# Patient Record
Sex: Male | Born: 1965 | Race: Black or African American | Hispanic: No | Marital: Single | Smoking: Current every day smoker
Health system: Southern US, Community
[De-identification: ages and names within clinical notes are randomized; demographics above are authoritative.]

## PROBLEM LIST (undated history)

## (undated) DIAGNOSIS — F32A Depression, unspecified: Secondary | ICD-10-CM

## (undated) DIAGNOSIS — R42 Dizziness and giddiness: Secondary | ICD-10-CM

## (undated) DIAGNOSIS — H269 Unspecified cataract: Secondary | ICD-10-CM

## (undated) DIAGNOSIS — F329 Major depressive disorder, single episode, unspecified: Secondary | ICD-10-CM

## (undated) DIAGNOSIS — M549 Dorsalgia, unspecified: Secondary | ICD-10-CM

## (undated) DIAGNOSIS — H5440 Blindness, one eye, unspecified eye: Secondary | ICD-10-CM

## (undated) HISTORY — DX: Depression, unspecified: F32.A

## (undated) HISTORY — DX: Major depressive disorder, single episode, unspecified: F32.9

## (undated) HISTORY — PX: APPENDECTOMY: SHX54

## (undated) HISTORY — DX: Dorsalgia, unspecified: M54.9

## (undated) HISTORY — DX: Blindness, one eye, unspecified eye: H54.40

---

## 2001-03-22 ENCOUNTER — Emergency Department (HOSPITAL_COMMUNITY): Admission: EM | Admit: 2001-03-22 | Discharge: 2001-03-22 | Payer: Self-pay | Admitting: Internal Medicine

## 2001-06-02 ENCOUNTER — Emergency Department (HOSPITAL_COMMUNITY): Admission: EM | Admit: 2001-06-02 | Discharge: 2001-06-02 | Payer: Self-pay | Admitting: *Deleted

## 2001-06-06 ENCOUNTER — Encounter: Payer: Self-pay | Admitting: Internal Medicine

## 2001-06-06 ENCOUNTER — Ambulatory Visit (HOSPITAL_COMMUNITY): Admission: RE | Admit: 2001-06-06 | Discharge: 2001-06-06 | Payer: Self-pay | Admitting: Internal Medicine

## 2002-10-22 ENCOUNTER — Encounter: Payer: Self-pay | Admitting: Emergency Medicine

## 2002-10-22 ENCOUNTER — Emergency Department (HOSPITAL_COMMUNITY): Admission: EM | Admit: 2002-10-22 | Discharge: 2002-10-22 | Payer: Self-pay | Admitting: Emergency Medicine

## 2003-05-09 ENCOUNTER — Emergency Department (HOSPITAL_COMMUNITY): Admission: EM | Admit: 2003-05-09 | Discharge: 2003-05-09 | Payer: Self-pay | Admitting: Emergency Medicine

## 2003-06-08 ENCOUNTER — Encounter: Payer: Self-pay | Admitting: Emergency Medicine

## 2003-06-08 ENCOUNTER — Emergency Department (HOSPITAL_COMMUNITY): Admission: EM | Admit: 2003-06-08 | Discharge: 2003-06-08 | Payer: Self-pay | Admitting: Emergency Medicine

## 2004-02-17 ENCOUNTER — Emergency Department (HOSPITAL_COMMUNITY): Admission: EM | Admit: 2004-02-17 | Discharge: 2004-02-17 | Payer: Self-pay | Admitting: Emergency Medicine

## 2007-11-10 ENCOUNTER — Emergency Department (HOSPITAL_COMMUNITY): Admission: EM | Admit: 2007-11-10 | Discharge: 2007-11-10 | Payer: Self-pay | Admitting: Emergency Medicine

## 2008-03-04 ENCOUNTER — Emergency Department (HOSPITAL_COMMUNITY): Admission: EM | Admit: 2008-03-04 | Discharge: 2008-03-04 | Payer: Self-pay | Admitting: Emergency Medicine

## 2008-09-26 ENCOUNTER — Emergency Department (HOSPITAL_COMMUNITY): Admission: EM | Admit: 2008-09-26 | Discharge: 2008-09-26 | Payer: Self-pay | Admitting: Emergency Medicine

## 2010-08-13 ENCOUNTER — Emergency Department (HOSPITAL_COMMUNITY): Admission: EM | Admit: 2010-08-13 | Discharge: 2010-08-13 | Payer: Self-pay | Admitting: Emergency Medicine

## 2010-08-14 ENCOUNTER — Emergency Department (HOSPITAL_COMMUNITY): Admission: EM | Admit: 2010-08-14 | Discharge: 2010-08-14 | Payer: Self-pay | Admitting: Emergency Medicine

## 2011-01-20 LAB — CBC
HCT: 44.5 % (ref 39.0–52.0)
Hemoglobin: 14.7 g/dL (ref 13.0–17.0)
MCH: 29.7 pg (ref 26.0–34.0)
MCV: 89.9 fL (ref 78.0–100.0)
RBC: 4.95 MIL/uL (ref 4.22–5.81)
WBC: 6.7 10*3/uL (ref 4.0–10.5)

## 2011-01-20 LAB — BASIC METABOLIC PANEL
CO2: 29 mEq/L (ref 19–32)
Chloride: 104 mEq/L (ref 96–112)
Glucose, Bld: 117 mg/dL — ABNORMAL HIGH (ref 70–99)
Potassium: 4.1 mEq/L (ref 3.5–5.1)
Sodium: 138 mEq/L (ref 135–145)

## 2011-01-20 LAB — DIFFERENTIAL
Eosinophils Absolute: 0 10*3/uL (ref 0.0–0.7)
Eosinophils Relative: 1 % (ref 0–5)
Lymphocytes Relative: 14 % (ref 12–46)
Lymphs Abs: 0.9 10*3/uL (ref 0.7–4.0)
Monocytes Relative: 10 % (ref 3–12)

## 2011-07-12 ENCOUNTER — Encounter: Payer: Self-pay | Admitting: Emergency Medicine

## 2011-07-12 ENCOUNTER — Emergency Department (HOSPITAL_COMMUNITY)
Admission: EM | Admit: 2011-07-12 | Discharge: 2011-07-12 | Disposition: A | Discharge status: Home / Self Care | Payer: Self-pay | Attending: Emergency Medicine | Admitting: Emergency Medicine

## 2011-07-12 ENCOUNTER — Emergency Department (HOSPITAL_COMMUNITY): Payer: Self-pay

## 2011-07-12 DIAGNOSIS — IMO0002 Reserved for concepts with insufficient information to code with codable children: Secondary | ICD-10-CM | POA: Insufficient documentation

## 2011-07-12 DIAGNOSIS — S61209A Unspecified open wound of unspecified finger without damage to nail, initial encounter: Secondary | ICD-10-CM | POA: Insufficient documentation

## 2011-07-12 DIAGNOSIS — S62639A Displaced fracture of distal phalanx of unspecified finger, initial encounter for closed fracture: Secondary | ICD-10-CM | POA: Insufficient documentation

## 2011-07-12 DIAGNOSIS — F17200 Nicotine dependence, unspecified, uncomplicated: Secondary | ICD-10-CM | POA: Insufficient documentation

## 2011-07-12 HISTORY — DX: Dizziness and giddiness: R42

## 2011-07-12 MED ORDER — "THROMBI-PAD 3""X3"" EX PADS"
MEDICATED_PAD | CUTANEOUS | Status: AC
  Filled 2011-07-12: qty 1

## 2011-07-12 MED ORDER — TETANUS-DIPHTHERIA TOXOIDS TD 5-2 LFU IM INJ
0.5000 mL | INJECTION | Freq: Once | INTRAMUSCULAR | Status: AC
  Administered 2011-07-12: 0.5 mL via INTRAMUSCULAR
  Filled 2011-07-12: qty 0.5

## 2011-07-12 MED ORDER — HYDROCODONE-ACETAMINOPHEN 5-325 MG PO TABS: ORAL_TABLET | ORAL | Status: AC

## 2011-07-12 MED ORDER — CEFAZOLIN SODIUM 1 G IJ SOLR
1.0000 g | Freq: Once | INTRAMUSCULAR | Status: AC
  Administered 2011-07-12: 1 g via INTRAMUSCULAR
  Filled 2011-07-12: qty 10

## 2011-07-12 MED ORDER — CEPHALEXIN 500 MG PO CAPS: 500.0000 mg | ORAL_CAPSULE | Freq: Four times a day (QID) | ORAL | Status: AC

## 2011-07-12 NOTE — ED Notes (Signed)
Pt trying to fix wheel on mower, hit blade with his Right hand. Laceration to 2nd digit of R hand.

## 2011-07-12 NOTE — ED Provider Notes (Signed)
History     CSN: [id number] Arrival date & time: 07/12/2011 12:14 PM  Chief Complaint  Patient presents with  . Laceration   HPI Pt was seen at 1345.  Per pt, c/o sudden onset and persistence of constant right middle fingertip wound that began today PTA.  Pt states he needed to fix a wheel on his lawnmower, shut the mower off, but was hit on his right middle fingertip by the blade.  Pt is left handed.  Denies any other injuries.  Denies tingling/numbness in extremity, no focal motor weakness.    Past Medical History  Diagnosis Date  . Dizzy spells     Past Surgical History  Procedure Date  . Appendectomy     Family History  Problem Relation Age of Onset  . Diabetes Mother   . Hypertension Mother   . Cancer Other     History  Substance Use Topics  . Smoking status: Current Everyday Smoker -- 0.5 packs/day    Types: Cigarettes  . Smokeless tobacco: Never Used  . Alcohol Use: No     Review of Systems ROS: Statement: All systems negative except as marked or noted in the HPI; Constitutional: Negative for fever and chills. ; ; Eyes: Negative for eye pain, redness and discharge. ; ; ENMT: Negative for ear pain, hoarseness, nasal congestion, sinus pressure and sore throat. ; ; Cardiovascular: Negative for chest pain, palpitations, diaphoresis, dyspnea and peripheral edema. ; ; Respiratory: Negative for cough, wheezing and stridor. ; ; Gastrointestinal: Negative for nausea, vomiting, diarrhea and abdominal pain, blood in stool, hematemesis, jaundice and rectal bleeding. . ; ; Genitourinary: Negative for dysuria, flank pain and hematuria. ; ; Musculoskeletal: Negative for back pain and neck pain. ; Skin: +finger wound.  Negative for pruritus, rash, abrasions, blisters, bruising and skin lesion.; ; Neuro: Negative for headache, lightheadedness and neck stiffness. Negative for weakness, altered level of consciousness , altered mental status, extremity weakness, paresthesias, involuntary  movement, seizure and syncope.     Physical Exam  BP 120/74  Pulse 70  Temp(Src) 98.4 F (36.9 C) (Oral)  Resp 20  Ht 5\' 7"  (1.702 m)  Wt 170 lb (77.111 kg)  BMI 26.63 kg/m2  SpO2 100%  Physical Exam 1350: Physical examination:  Nursing notes reviewed; Vital signs and O2 SAT reviewed;  Constitutional: Well developed, Well nourished, Well hydrated, In no acute distress; Head:  Normocephalic, atraumatic; Eyes: EOMI, PERRL, No scleral icterus; ENMT: Mouth and pharynx normal, Mucous membranes moist; Neck: Supple, Full range of motion, No lymphadenopathy; Cardiovascular: Regular rate and rhythm, No murmur, rub, or gallop; Respiratory: Breath sounds clear & equal bilaterally, No rales, rhonchi, wheezes, or rub, Normal respiratory effort/excursion; Chest: Nontender, Movement normal;  Extremities: Pulses normal, No tenderness, No edema, No calf edema or asymmetry.; Neuro: AA&Ox3, Major CN grossly intact.  No gross focal motor or sensory deficits in extremities.; Skin: Color normal, Warm, Dry.  +approx 1 cm diameter skin avulsion to R hand middle finger tip, including most distal portion of distal nail.  No subungual hematoma.  Minimal active bleeding.  Wound explored with adequate hemostasis through ROM, as well as in position at the time of injury.  No apparent gross retained foreign body, no visualized involvement of deep structures such as bone/joint/tendon noted.  Baseline strength and sensation to finger with normal light touch, and strong peripheral pulses.  Right hand with motor strength intact through F/E, though pt c/o increasing pain right middle fingertip.  ED Course  Procedures  MDM MDM Reviewed: nursing note and vitals Interpretation: x-ray   Dg Finger Middle Right  07/12/2011  *RADIOLOGY REPORT*  Clinical Data: Laceration and pain.  RIGHT MIDDLE FINGER 2+V  Comparison: None.  Findings: There is a comminuted fracture of the distal phalangeal tuft.  There may be small foci of  radiopaque debris within the overlying injured soft tissues. No joint disruption.  IMPRESSION: A highly comminuted distal phalangeal tuft fracture with probable radiopaque debris in the overlying injured soft tissues.  Original Report Authenticated By: Reyes Ivan, M.D.     1535:  Pt repeating to staff he "doesn't want stitches or shots if I don't have to."  Wound skin appears more avulsed than lacerated and does not appear to have any lacs that can be closed with sutures.  Would cleaned well with NS after xray completed.  No visible debris.  Minimal active bleeding.  IM ancef and Td update given with pt's agreement.  T/C to Ortho Dr. Romeo Apple, case discussed, including:  HPI, pertinent PM/SHx, VS/PE, dx testing, ED course and treatment.  Agreeable to f/u pt in ofc tomorrow and plan to cover with gelfoam, bulky dressing, splint, rx keflex.  Dx testing d/w pt.  Questions answered.  Verb understanding, agreeable to d/c home with outpt f/u tomorrow with Ortho.    Jobina Maita Allison Quarry, DO 07/14/11 1423

## 2011-07-14 ENCOUNTER — Encounter (HOSPITAL_COMMUNITY): Payer: Self-pay | Admitting: *Deleted

## 2011-07-14 ENCOUNTER — Emergency Department (HOSPITAL_COMMUNITY)
Admission: EM | Admit: 2011-07-14 | Discharge: 2011-07-14 | Disposition: A | Discharge status: Home / Self Care | Payer: Self-pay | Attending: Emergency Medicine | Admitting: Emergency Medicine

## 2011-07-14 DIAGNOSIS — Z5189 Encounter for other specified aftercare: Secondary | ICD-10-CM | POA: Insufficient documentation

## 2011-07-14 DIAGNOSIS — S61219A Laceration without foreign body of unspecified finger without damage to nail, initial encounter: Secondary | ICD-10-CM

## 2011-07-14 MED ORDER — CEPHALEXIN 500 MG PO CAPS
500.0000 mg | ORAL_CAPSULE | Freq: Once | ORAL | Status: AC
  Administered 2011-07-14: 500 mg via ORAL
  Filled 2011-07-14: qty 1

## 2011-07-14 MED ORDER — CEPHALEXIN 500 MG PO CAPS: 500.0000 mg | ORAL_CAPSULE | Freq: Four times a day (QID) | ORAL | Status: AC

## 2011-07-14 NOTE — ED Provider Notes (Signed)
History     CSN: [id number] Arrival date & time: 07/14/2011  8:23 AM  Chief Complaint  Patient presents with  . Wound Check   HPI Comments: Portion of R 3rd finger pad avulsed with underlying distal phalynx fracture.  surgicell in place no active bleeding.  No swelling, no streaking and no lymphadenapathy.  Patient is a 45 y.o. male presenting with wound check. The history is provided by the patient. No language interpreter was used.  Wound Check  He was treated in the ED 2 to 3 days ago. Treatments since wound repair include antibiotic ointment use and oral antibiotics. His temperature was unmeasured prior to arrival. There has been no drainage from the wound. There is no redness present. There is no swelling present. The pain has not changed. He has no difficulty moving the affected extremity or digit.    Past Medical History  Diagnosis Date  . Dizzy spells     Past Surgical History  Procedure Date  . Appendectomy     Family History  Problem Relation Age of Onset  . Diabetes Mother   . Hypertension Mother   . Cancer Other     History  Substance Use Topics  . Smoking status: Current Everyday Smoker -- 0.5 packs/day    Types: Cigarettes  . Smokeless tobacco: Never Used  . Alcohol Use: No      Review of Systems  Skin: Positive for wound.  All other systems reviewed and are negative.    Physical Exam  BP 109/68  Pulse 60  Temp(Src) 98.1 F (36.7 C) (Oral)  Resp 16  Ht 5\' 7"  (1.702 m)  Wt 170 lb (77.111 kg)  BMI 26.63 kg/m2  SpO2 99%  Physical Exam  Nursing note and vitals reviewed. Constitutional: He is oriented to person, place, and time. Vital signs are normal. He appears well-developed and well-nourished. No distress.  HENT:  Head: Normocephalic and atraumatic.  Right Ear: External ear normal.  Left Ear: External ear normal.  Nose: Nose normal.  Mouth/Throat: No oropharyngeal exudate.  Eyes: Conjunctivae and EOM are normal. Pupils are equal, round,  and reactive to light. Right eye exhibits no discharge. Left eye exhibits no discharge. No scleral icterus.  Neck: Normal range of motion. Neck supple. No JVD present. No tracheal deviation present. No thyromegaly present.  Cardiovascular: Normal rate, regular rhythm, normal heart sounds, intact distal pulses and normal pulses.  Exam reveals no gallop and no friction rub.   No murmur heard. Pulmonary/Chest: Effort normal and breath sounds normal. No stridor. No respiratory distress. He has no wheezes. He has no rales. He exhibits no tenderness.  Abdominal: Soft. Normal appearance and bowel sounds are normal. He exhibits no distension and no mass. There is no tenderness. There is no rebound and no guarding.  Musculoskeletal: He exhibits no edema and no tenderness.       Right hand: He exhibits decreased range of motion, tenderness, bony tenderness and laceration. He exhibits normal capillary refill, no deformity and no swelling.       Hands: Lymphadenopathy:    He has no cervical adenopathy.  Neurological: He is alert and oriented to person, place, and time. He has normal reflexes. No cranial nerve deficit. Coordination normal. GCS eye subscore is 4. GCS verbal subscore is 5. GCS motor subscore is 6.  Reflex Scores:      Tricep reflexes are 2+ on the right side and 2+ on the left side.      Bicep reflexes are  2+ on the right side and 2+ on the left side.      Brachioradialis reflexes are 2+ on the right side and 2+ on the left side.      Patellar reflexes are 2+ on the right side and 2+ on the left side.      Achilles reflexes are 2+ on the right side and 2+ on the left side. Skin: Skin is warm and dry. No rash noted. He is not diaphoretic.  Psychiatric: He has a normal mood and affect. His speech is normal and behavior is normal. Judgment and thought content normal. Cognition and memory are normal.    ED Course  Procedures  MDM       Worthy Rancher, PA 07/14/11 [id number]

## 2011-07-14 NOTE — ED Notes (Signed)
Waiting for MD---graham crackers and diet coke given

## 2011-07-14 NOTE — ED Notes (Signed)
Finger splint removed from right 3rd digit--dressing irrigated with normal saline to loosen---tip of digit has a mangled appearance--partial missing---dressing with dried blood--no obvious purulent drainage and minimal swelling---voices throbbing pain when not using arm sling--has been unable to afford the antibiotic---advised him he could get it on the $4 program [facility] offers--he said he thought it would be much more expensive than that and will be able to afford $4.

## 2011-07-14 NOTE — ED Notes (Signed)
P.A. In to see pt.  Telfa, Sterile 4 x 4, tube dressing, finger splint applied and secured with ace wrap.  Pt. tolerated well.

## 2011-07-14 NOTE — ED Notes (Signed)
Pt came to ed today to have wound rechecked. Pt cut tip of right middle finger off on Monday of this week.

## 2011-07-15 NOTE — ED Provider Notes (Signed)
Medical screening examination/treatment/procedure(s) were performed by non-physician practitioner and as supervising physician I was immediately available for consultation/collaboration.  Hurman Horn, MD 07/15/11 [phone]

## 2012-06-24 ENCOUNTER — Emergency Department (HOSPITAL_COMMUNITY)
Admission: EM | Admit: 2012-06-24 | Discharge: 2012-06-24 | Disposition: A | Discharge status: Home / Self Care | Payer: Self-pay | Attending: Emergency Medicine | Admitting: Emergency Medicine

## 2012-06-24 ENCOUNTER — Encounter (HOSPITAL_COMMUNITY): Payer: Self-pay | Admitting: *Deleted

## 2012-06-24 DIAGNOSIS — H269 Unspecified cataract: Secondary | ICD-10-CM

## 2012-06-24 DIAGNOSIS — H5710 Ocular pain, unspecified eye: Secondary | ICD-10-CM | POA: Insufficient documentation

## 2012-06-24 DIAGNOSIS — F17200 Nicotine dependence, unspecified, uncomplicated: Secondary | ICD-10-CM | POA: Insufficient documentation

## 2012-06-24 HISTORY — DX: Unspecified cataract: H26.9

## 2012-06-24 MED ORDER — HYDROCODONE-ACETAMINOPHEN 5-325 MG PO TABS
1.0000 | ORAL_TABLET | Freq: Once | ORAL | Status: AC
  Administered 2012-06-24: 1 via ORAL
  Filled 2012-06-24: qty 1

## 2012-06-24 MED ORDER — HYDROCODONE-ACETAMINOPHEN 5-325 MG PO TABS: 1.0000 | ORAL_TABLET | Freq: Four times a day (QID) | ORAL | Status: AC | PRN

## 2012-06-24 NOTE — ED Provider Notes (Cosign Needed)
History  This chart was scribed for Kevin Lennert, MD by Kevin Moreno. This patient was seen in room TR02C/TR02C and the patient's care was started at 1529.   CSN: [id number]  Arrival date & time 06/24/12  1529   None     Chief Complaint  Patient presents with  . Eye Pain   Patient is a 46 y.o. male presenting with eye pain. The history is provided by the patient. No language interpreter was used.  Eye Pain This is a chronic problem. The current episode started more than 1 week ago. The problem occurs constantly. The problem has been gradually worsening. Pertinent negatives include no chest pain, no abdominal pain and no headaches. Nothing aggravates the symptoms. Nothing relieves the symptoms. He has tried nothing for the symptoms.   Kevin Moreno is a 46 y.o. male who presents to the Emergency Department complaining of constant gradually worsening left eye pain and and vision complaints. He states for the past five days he has had worsening vision of his left eye which is usually blurry from cataracts but is now dark and he sees almost nothing from his left eye. He also states left upper eye pain which he was told is from a torn ligament in his eye. He states that he recently saw Dr. Marlyne Beards for this problem and is scheduled to see eye DM specialist Dr. Ashley Royalty in two days for this problem. He denies any other injuries/illnesses at this time.   Past Medical History  Diagnosis Date  . Dizzy spells   . Cataract     Past Surgical History  Procedure Date  . Appendectomy     Family History  Problem Relation Age of Onset  . Diabetes Mother   . Hypertension Mother   . Cancer Other     History  Substance Use Topics  . Smoking status: Current Everyday Smoker -- 0.5 packs/Moreno    Types: Cigarettes  . Smokeless tobacco: Never Used  . Alcohol Use: No      Review of Systems  Constitutional: Negative for fatigue.  HENT: Negative for congestion, sinus pressure and ear  discharge.   Eyes: Positive for pain. Negative for discharge.       Left upper eye pain and worsening vision.   Respiratory: Negative for cough.   Cardiovascular: Negative for chest pain.  Gastrointestinal: Negative for abdominal pain and diarrhea.  Genitourinary: Negative for frequency and hematuria.  Musculoskeletal: Negative for back pain.  Skin: Negative for rash.  Neurological: Negative for seizures and headaches.  Hematological: Negative.   Psychiatric/Behavioral: Negative for hallucinations.  All other systems reviewed and are negative.    Allergies  Review of patient's allergies indicates no known allergies.  Home Medications   Current Outpatient Rx  Name Route Sig Dispense Refill  . ACETAMINOPHEN 500 MG PO TABS Oral Take 500 mg by mouth every 6 (six) hours as needed. For pain    . CLEAR EYES MAX REDNESS RELIEF OP Left Eye Place 1 drop into the left eye daily as needed. For dry/red eyes      Triage Vitals: BP 123/76  Pulse 77  Temp 98.2 F (36.8 C) (Oral)  Resp 20  SpO2 100%  Physical Exam  Nursing note and vitals reviewed. Constitutional: He is oriented to person, place, and time. He appears well-developed.  HENT:  Head: Normocephalic.  Eyes: Conjunctivae are normal.       Left irregular pupil 4 cm dilated and non response to light. His left  upper eyelid is tender.   Neck: No tracheal deviation present.  Cardiovascular:  No murmur heard. Musculoskeletal: Normal range of motion.  Neurological: He is oriented to person, place, and time.  Skin: Skin is warm.  Psychiatric: He has a normal mood and affect.   Pressure checked with tonopen.  Pressure right eye 20 ED Course  Procedures (including critical care time) DIAGNOSTIC STUDIES: Oxygen Saturation is 100% on room air, normal by my interpretation.    COORDINATION OF CARE: At 400 PM Discussed treatment plan with patient which includes consult to opthamology. Patient agrees.   Labs Reviewed - No data to  display No results found.   No diagnosis found.  I spoke with dr. Luciana Axe and he will see the pt Monday if dr. Ashley Royalty does not see the pt  MDM  The chart was scribed for me under my direct supervision.  I personally performed the history, physical, and medical decision making and all procedures in the evaluation of this patient.Kevin Lennert, MD 06/24/12 1700

## 2012-06-24 NOTE — ED Notes (Signed)
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 Was told to come here if he had any pain.

## 2012-06-26 ENCOUNTER — Encounter (INDEPENDENT_AMBULATORY_CARE_PROVIDER_SITE_OTHER): Payer: Self-pay | Admitting: Ophthalmology

## 2012-11-28 ENCOUNTER — Encounter (HOSPITAL_COMMUNITY): Payer: Self-pay | Admitting: Emergency Medicine

## 2012-11-28 ENCOUNTER — Emergency Department (HOSPITAL_COMMUNITY)
Admission: EM | Admit: 2012-11-28 | Discharge: 2012-11-28 | Disposition: A | Discharge status: Home / Self Care | Payer: Self-pay | Attending: Emergency Medicine | Admitting: Emergency Medicine

## 2012-11-28 DIAGNOSIS — R21 Rash and other nonspecific skin eruption: Secondary | ICD-10-CM | POA: Insufficient documentation

## 2012-11-28 DIAGNOSIS — F17200 Nicotine dependence, unspecified, uncomplicated: Secondary | ICD-10-CM | POA: Insufficient documentation

## 2012-11-28 DIAGNOSIS — L02419 Cutaneous abscess of limb, unspecified: Secondary | ICD-10-CM | POA: Insufficient documentation

## 2012-11-28 DIAGNOSIS — Z8669 Personal history of other diseases of the nervous system and sense organs: Secondary | ICD-10-CM | POA: Insufficient documentation

## 2012-11-28 MED ORDER — BACITRACIN ZINC 500 UNIT/GM EX OINT
TOPICAL_OINTMENT | CUTANEOUS | Status: AC
  Administered 2012-11-28: 1
  Filled 2012-11-28: qty 0.9

## 2012-11-28 MED ORDER — SULFAMETHOXAZOLE-TMP DS 800-160 MG PO TABS
1.0000 | ORAL_TABLET | Freq: Once | ORAL | Status: AC
  Administered 2012-11-28: 1 via ORAL
  Filled 2012-11-28: qty 1

## 2012-11-28 MED ORDER — HYDROCODONE-ACETAMINOPHEN 5-325 MG PO TABS: ORAL_TABLET | ORAL | Status: DC

## 2012-11-28 MED ORDER — SULFAMETHOXAZOLE-TRIMETHOPRIM 800-160 MG PO TABS: 1.0000 | ORAL_TABLET | Freq: Two times a day (BID) | ORAL | Status: DC

## 2012-11-28 NOTE — ED Notes (Signed)
Pt presents with abscess to back of right lower leg.

## 2012-11-28 NOTE — ED Provider Notes (Signed)
History     CSN: [id number]  Arrival date & time 11/28/12  1146   First MD Initiated Contact with Patient 11/28/12 1230      Chief Complaint  Patient presents with  . Abscess    (Consider location/radiation/quality/duration/timing/severity/associated sxs/prior treatment) HPI Comments: Patient c/o redness, painful and slightly itchy lesion to the right lower leg.  States she is unsure if he may have been bitten by a spider or insect.  Denies hx of previous boils or MRSA.  Pain is worse with weight bearing.  He denies fever, chills, swelling or weakness of the affected extremity  Patient is a 47 y.o. male presenting with rash. The history is provided by the patient.  Rash  This is a new problem. The current episode started more than 2 days ago. The problem has not changed since onset.The problem is associated with an unknown factor. There has been no fever. The rash is present on the right lower leg. The pain is mild. The pain has been constant since onset. Associated symptoms include itching and pain. He has tried nothing for the symptoms. The treatment provided no relief.    Past Medical History  Diagnosis Date  . Dizzy spells   . Cataract     Past Surgical History  Procedure Date  . Appendectomy     Family History  Problem Relation Age of Onset  . Diabetes Mother   . Hypertension Mother   . Cancer Other     History  Substance Use Topics  . Smoking status: Current Every Day Smoker -- 0.5 packs/day    Types: Cigarettes  . Smokeless tobacco: Never Used  . Alcohol Use: No      Review of Systems  Constitutional: Negative for fever and chills.  Gastrointestinal: Negative for nausea and vomiting.  Musculoskeletal: Negative for joint swelling and arthralgias.  Skin: Positive for color change and itching. Negative for rash.       Red lesion to right lower leg  Hematological: Negative for adenopathy.  All other systems reviewed and are negative.    Allergies    Review of patient's allergies indicates no known allergies.  Home Medications   Current Outpatient Rx  Name  Route  Sig  Dispense  Refill  . ACETAMINOPHEN 500 MG PO TABS   Oral   Take 500 mg by mouth every 6 (six) hours as needed. For pain         . CLEAR EYES MAX REDNESS RELIEF OP   Left Eye   Place 1 drop into the left eye daily as needed. For dry/red eyes           BP 121/86  Pulse 82  Temp 98.3 F (36.8 C) (Oral)  Resp 18  SpO2 100%  Physical Exam  Nursing note and vitals reviewed. Constitutional: He is oriented to person, place, and time. He appears well-developed and well-nourished. No distress.  HENT:  Head: Normocephalic and atraumatic.  Cardiovascular: Normal rate, regular rhythm and normal heart sounds.   Pulmonary/Chest: Effort normal and breath sounds normal.  Musculoskeletal: He exhibits tenderness. He exhibits no edema.       Right lower leg: He exhibits no bony tenderness, no swelling, no deformity and no laceration.       Legs: Neurological: He is alert and oriented to person, place, and time. He exhibits normal muscle tone. Coordination normal.  Skin: Skin is warm. There is erythema.       Localized area of erythema with mild induration  to the right lower leg.  No fluctuance, no surrounding erythema/      ED Course  Procedures (including critical care time)  Labs Reviewed - No data to display No results found.      MDM    Localized area of erythema and induration to the right lower leg.  No fluctuance or drainage.  Probable early abscess.  NV intact.  Pt agrees to elevate , warm water soaks and abx.  Advised to return here if the sx's worsen  Prescribed: Bactrim DS norco #20      Shadiyah Wernli L. Batool Majid, [place] 11/30/12 1603

## 2012-11-30 NOTE — ED Provider Notes (Signed)
Medical screening examination/treatment/procedure(s) were performed by non-physician practitioner and as supervising physician I was immediately available for consultation/collaboration.   Cayson Kalb L Cleo Villamizar, MD 11/30/12 1630 

## 2012-12-01 ENCOUNTER — Emergency Department (HOSPITAL_COMMUNITY)
Admission: EM | Admit: 2012-12-01 | Discharge: 2012-12-01 | Disposition: A | Discharge status: Home / Self Care | Payer: Self-pay | Attending: Emergency Medicine | Admitting: Emergency Medicine

## 2012-12-01 ENCOUNTER — Encounter (HOSPITAL_COMMUNITY): Payer: Self-pay | Admitting: *Deleted

## 2012-12-01 DIAGNOSIS — L03119 Cellulitis of unspecified part of limb: Secondary | ICD-10-CM | POA: Insufficient documentation

## 2012-12-01 DIAGNOSIS — L02419 Cutaneous abscess of limb, unspecified: Secondary | ICD-10-CM | POA: Insufficient documentation

## 2012-12-01 DIAGNOSIS — L0291 Cutaneous abscess, unspecified: Secondary | ICD-10-CM

## 2012-12-01 DIAGNOSIS — F17200 Nicotine dependence, unspecified, uncomplicated: Secondary | ICD-10-CM | POA: Insufficient documentation

## 2012-12-01 DIAGNOSIS — H269 Unspecified cataract: Secondary | ICD-10-CM | POA: Insufficient documentation

## 2012-12-01 DIAGNOSIS — Z8669 Personal history of other diseases of the nervous system and sense organs: Secondary | ICD-10-CM | POA: Insufficient documentation

## 2012-12-01 DIAGNOSIS — A4901 Methicillin susceptible Staphylococcus aureus infection, unspecified site: Secondary | ICD-10-CM | POA: Insufficient documentation

## 2012-12-01 MED ORDER — LIDOCAINE-EPINEPHRINE (PF) 1 %-1:200000 IJ SOLN
10.0000 mL | Freq: Once | INTRAMUSCULAR | Status: DC
  Filled 2012-12-01: qty 10

## 2012-12-01 MED ORDER — OXYCODONE-ACETAMINOPHEN 5-325 MG PO TABS: 1.0000 | ORAL_TABLET | Freq: Four times a day (QID) | ORAL | Status: AC | PRN

## 2012-12-01 MED ORDER — SULFAMETHOXAZOLE-TMP DS 800-160 MG PO TABS
1.0000 | ORAL_TABLET | Freq: Once | ORAL | Status: AC
  Administered 2012-12-01: 1 via ORAL
  Filled 2012-12-01: qty 1

## 2012-12-01 MED ORDER — AMOXICILLIN 500 MG PO CAPS: 500.0000 mg | ORAL_CAPSULE | Freq: Three times a day (TID) | ORAL | Status: DC

## 2012-12-01 MED ORDER — AMOXICILLIN-POT CLAVULANATE 875-125 MG PO TABS
1.0000 | ORAL_TABLET | Freq: Once | ORAL | Status: AC
  Administered 2012-12-01: 1 via ORAL
  Filled 2012-12-01: qty 1

## 2012-12-01 MED ORDER — ONDANSETRON HCL 4 MG PO TABS
4.0000 mg | ORAL_TABLET | Freq: Once | ORAL | Status: AC
  Administered 2012-12-01: 4 mg via ORAL
  Filled 2012-12-01: qty 1

## 2012-12-01 NOTE — ED Provider Notes (Signed)
History     CSN: [id number]  Arrival date & time 12/01/12  1344   First MD Initiated Contact with Patient 12/01/12 1401      Chief Complaint  Patient presents with  . Wound Check    (Consider location/radiation/quality/duration/timing/severity/associated sxs/prior treatment) Patient is a 47 y.o. male presenting with wound check. The history is provided by the patient.  Wound Check  He was treated in the ED 5 to 10 days ago. Prior ED Treatment: treatment of abscess of the right calf. Treatments since wound repair include oral antibiotics. Fever duration: none. There has been no drainage from the wound. The redness has not changed. The swelling has not changed. The pain has worsened.    Past Medical History  Diagnosis Date  . Dizzy spells   . Cataract     Past Surgical History  Procedure Date  . Appendectomy     Family History  Problem Relation Age of Onset  . Diabetes Mother   . Hypertension Mother   . Cancer Other     History  Substance Use Topics  . Smoking status: Current Every Day Smoker -- 0.5 packs/day    Types: Cigarettes  . Smokeless tobacco: Never Used  . Alcohol Use: No      Review of Systems  Constitutional: Negative for activity change.       All ROS Neg except as noted in HPI  HENT: Negative for nosebleeds and neck pain.   Eyes: Negative for photophobia and discharge.  Respiratory: Negative for cough, shortness of breath and wheezing.   Cardiovascular: Negative for chest pain and palpitations.  Gastrointestinal: Negative for abdominal pain and blood in stool.  Genitourinary: Negative for dysuria, frequency and hematuria.  Musculoskeletal: Negative for back pain and arthralgias.  Skin: Positive for wound.  Neurological: Negative for dizziness, seizures and speech difficulty.  Psychiatric/Behavioral: Negative for hallucinations and confusion.    Allergies  Review of patient's allergies indicates no known allergies.  Home Medications    Current Outpatient Rx  Name  Route  Sig  Dispense  Refill  . ACETAMINOPHEN 500 MG PO TABS   Oral   Take 500 mg by mouth every 6 (six) hours as needed. For pain         . IBUPROFEN 200 MG PO TABS   Oral   Take 600 mg by mouth every 6 (six) hours as needed. For pain         . CLEAR EYES MAX REDNESS RELIEF OP   Left Eye   Place 1 drop into the left eye daily as needed. For dry/red eyes         . SULFAMETHOXAZOLE-TRIMETHOPRIM 800-160 MG PO TABS   Oral   Take 1 tablet by mouth 2 (two) times daily.   28 tablet   0     BP 130/76  Pulse 89  Temp 97.7 F (36.5 C) (Oral)  Resp 18  Ht 5\' 7"  (1.702 m)  Wt 160 lb (72.576 kg)  BMI 25.06 kg/m2  SpO2 100%  Physical Exam  Nursing note and vitals reviewed. Constitutional: He is oriented to person, place, and time. He appears well-developed and well-nourished.  Non-toxic appearance.  HENT:  Head: Normocephalic.  Right Ear: Tympanic membrane and external ear normal.  Left Ear: Tympanic membrane and external ear normal.  Eyes: EOM and lids are normal. Pupils are equal, round, and reactive to light.  Neck: Normal range of motion. Neck supple. Carotid bruit is not present.  Cardiovascular: Normal rate,  regular rhythm, normal heart sounds, intact distal pulses and normal pulses.   Pulmonary/Chest: Breath sounds normal. No respiratory distress.  Abdominal: Soft. Bowel sounds are normal. There is no tenderness. There is no guarding.  Musculoskeletal: Normal range of motion.       Abscess of the posterior calf on the right with increasing cellulitis margins. Tender to palpation.  Lymphadenopathy:       Head (right side): No submandibular adenopathy present.       Head (left side): No submandibular adenopathy present.    He has no cervical adenopathy.  Neurological: He is alert and oriented to person, place, and time. He has normal strength. No cranial nerve deficit or sensory deficit.  Skin: Skin is warm and dry.  Psychiatric: He  has a normal mood and affect. His speech is normal.    ED Course  INCISION AND DRAINAGE Date/Time: 12/01/2012 3:45 PM Performed by: Kathie Dike Authorized by: Kathie Dike Consent: Verbal consent obtained. Risks and benefits: risks, benefits and alternatives were discussed Consent given by: patient Patient understanding: patient states understanding of the procedure being performed Patient identity confirmed: arm band Time out: Immediately prior to procedure a "time out" was called to verify the correct patient, procedure, equipment, support staff and site/side marked as required. Type: abscess Body area: lower extremity Location details: right leg Anesthesia: local infiltration Local anesthetic: lidocaine 1% with epinephrine Patient sedated: no Scalpel size: 15 Incision type: single straight Complexity: simple Drainage: purulent Drainage amount: moderate Wound treatment: wound left open Patient tolerance: Patient tolerated the procedure well with no immediate complications.   (including critical care time)  Labs Reviewed - No data to display No results found.   No diagnosis found.    MDM  **I have reviewed nursing notes, vital signs, and all appropriate lab and imaging results for this patient.* Patient presented to the emergency department with an abscess of the right calf. Incision and drainage was carried out. Pt to continue septra. Amoxil added. Rx for percocet given. Pt to return if  Any change or problem.   Kathie Dike, [place] 12/05/12 1540

## 2012-12-01 NOTE — ED Notes (Signed)
Here for recheck of abscess of rt lower leg.

## 2012-12-04 LAB — CULTURE, ROUTINE-ABSCESS: Special Requests: NORMAL

## 2012-12-05 NOTE — ED Notes (Signed)
+  urine Patient treated with Septra-sensitive to same-chart appended per protocol MD. 

## 2012-12-06 NOTE — ED Provider Notes (Signed)
Medical screening examination/treatment/procedure(s) were performed by non-physician practitioner and as supervising physician I was immediately available for consultation/collaboration.  Mabell Esguerra, MD 12/06/12 2346 

## 2013-04-05 ENCOUNTER — Emergency Department (HOSPITAL_COMMUNITY)
Admission: EM | Admit: 2013-04-05 | Discharge: 2013-04-05 | Disposition: A | Discharge status: Home / Self Care | Payer: Self-pay | Attending: Emergency Medicine | Admitting: Emergency Medicine

## 2013-04-05 ENCOUNTER — Encounter (HOSPITAL_COMMUNITY): Payer: Self-pay | Admitting: Emergency Medicine

## 2013-04-05 DIAGNOSIS — F17200 Nicotine dependence, unspecified, uncomplicated: Secondary | ICD-10-CM | POA: Insufficient documentation

## 2013-04-05 DIAGNOSIS — N342 Other urethritis: Secondary | ICD-10-CM | POA: Insufficient documentation

## 2013-04-05 DIAGNOSIS — R369 Urethral discharge, unspecified: Secondary | ICD-10-CM | POA: Insufficient documentation

## 2013-04-05 DIAGNOSIS — Z8669 Personal history of other diseases of the nervous system and sense organs: Secondary | ICD-10-CM | POA: Insufficient documentation

## 2013-04-05 LAB — URINALYSIS, ROUTINE W REFLEX MICROSCOPIC
Bilirubin Urine: NEGATIVE
Ketones, ur: NEGATIVE mg/dL
Nitrite: NEGATIVE
Urobilinogen, UA: 1 mg/dL (ref 0.0–1.0)
pH: 6.5 (ref 5.0–8.0)

## 2013-04-05 LAB — URINE MICROSCOPIC-ADD ON

## 2013-04-05 MED ORDER — CEFIXIME 400 MG PO TABS
400.0000 mg | ORAL_TABLET | Freq: Every day | ORAL | Status: DC
  Administered 2013-04-05: 400 mg via ORAL
  Filled 2013-04-05: qty 1

## 2013-04-05 MED ORDER — AZITHROMYCIN 250 MG PO TABS
1000.0000 mg | ORAL_TABLET | Freq: Every day | ORAL | Status: DC
  Administered 2013-04-05: 1000 mg via ORAL
  Filled 2013-04-05: qty 4

## 2013-04-05 NOTE — ED Notes (Signed)
Pt c/o dysuria and white "pus" d/c from penis. X 4 days. Nad.

## 2013-04-05 NOTE — ED Provider Notes (Signed)
History     CSN: [id number]  Arrival date & time 04/05/13  1542   First MD Initiated Contact with Patient 04/05/13 1610      Chief Complaint  Patient presents with  . Dysuria    (Consider location/radiation/quality/duration/timing/severity/associated sxs/prior treatment) HPI Comments: Kevin Moreno is a 47 y.o. Male presenting with dysuria and white pus draining from his penis for the past 4 days.  He denies fevers, chills, nausea or vomiting.  He denies being sexually active for the past 4 months and uses condoms when he does have sex.  He has found no alleviators.  He denies rash.     The history is provided by the patient.    Past Medical History  Diagnosis Date  . Dizzy spells   . Cataract     Past Surgical History  Procedure Laterality Date  . Appendectomy      Family History  Problem Relation Age of Onset  . Diabetes Mother   . Hypertension Mother   . Cancer Other     History  Substance Use Topics  . Smoking status: Current Every Day Smoker -- 0.50 packs/day    Types: Cigarettes  . Smokeless tobacco: Never Used  . Alcohol Use: No      Review of Systems  Constitutional: Negative for fever.  HENT: Negative for congestion, sore throat and neck pain.   Eyes: Negative.   Respiratory: Negative for chest tightness and shortness of breath.   Cardiovascular: Negative for chest pain.  Gastrointestinal: Negative for nausea and abdominal pain.  Genitourinary: Positive for dysuria and discharge. Negative for urgency, frequency and flank pain.  Musculoskeletal: Negative for joint swelling and arthralgias.  Skin: Negative.  Negative for rash and wound.  Neurological: Negative for dizziness, weakness, light-headedness, numbness and headaches.  Psychiatric/Behavioral: Negative.     Allergies  Review of patient's allergies indicates no known allergies.  Home Medications   Current Outpatient Rx  Name  Route  Sig  Dispense  Refill  . acetaminophen  (TYLENOL) 500 MG tablet   Oral   Take 500 mg by mouth every 6 (six) hours as needed. For pain         . amoxicillin (AMOXIL) 500 MG capsule   Oral   Take 1 capsule (500 mg total) by mouth 3 (three) times daily.   21 capsule   0   . ibuprofen (ADVIL,MOTRIN) 200 MG tablet   Oral   Take 600 mg by mouth every 6 (six) hours as needed. For pain         . Naphazoline-Glycerin (CLEAR EYES MAX REDNESS RELIEF OP)   Left Eye   Place 1 drop into the left eye daily as needed. For dry/red eyes         . sulfamethoxazole-trimethoprim (SEPTRA DS) 800-160 MG per tablet   Oral   Take 1 tablet by mouth 2 (two) times daily.   28 tablet   0     BP 111/75  Pulse 94  Temp(Src) 97.6 F (36.4 C) (Oral)  Resp 17  SpO2 99%  Physical Exam  Nursing note and vitals reviewed. Constitutional: He appears well-developed and well-nourished.  HENT:  Head: Normocephalic and atraumatic.  Eyes: Conjunctivae are normal.  Neck: Normal range of motion.  Cardiovascular: Normal rate, regular rhythm, normal heart sounds and intact distal pulses.   Pulmonary/Chest: Effort normal and breath sounds normal. He has no wheezes.  Abdominal: Soft. Bowel sounds are normal. There is no tenderness.  Genitourinary: Testes normal.  Circumcised. Discharge found.  Musculoskeletal: Normal range of motion.  Neurological: He is alert.  Skin: Skin is warm and dry.  Psychiatric: He has a normal mood and affect.    ED Course  Procedures (including critical care time)  Labs Reviewed  URINALYSIS, ROUTINE W REFLEX MICROSCOPIC - Abnormal; Notable for the following:    APPearance CLOUDY (*)    Specific Gravity, Urine >1.030 (*)    Hgb urine dipstick TRACE (*)    Protein, ur TRACE (*)    Leukocytes, UA TRACE (*)    All other components within normal limits  URINE MICROSCOPIC-ADD ON - Abnormal; Notable for the following:    Bacteria, UA FEW (*)    All other components within normal limits  URINE CULTURE   No results  found.   No diagnosis found.    MDM  Patients labs and/or radiological studies were viewed and considered during the medical decision making and disposition process.   Gc/chlamydia cx obtained,  ua culture pending. Pt was given zithromax 1 gram,  Cefixime 400 mg po x 1.  Prn f/u.  Pt advised of pending cultures.  Also referred to health dept for f/u care.  Advised safe sex, abstinence for the next 7 days or until sx resolve.        Burgess Amor, PA-C 04/05/13 1653

## 2013-04-05 NOTE — ED Provider Notes (Signed)
Medical screening examination/treatment/procedure(s) were performed by non-physician practitioner and as supervising physician I was immediately available for consultation/collaboration.   Haneefah Venturini L Gurpreet Mikhail, MD 04/05/13 2352 

## 2013-04-05 NOTE — ED Notes (Signed)
Pt started having sl dysuria yesterday with d/c, alert, NAD

## 2013-04-06 LAB — URINE CULTURE
Colony Count: NO GROWTH
Culture: NO GROWTH

## 2013-04-10 LAB — GC/CHLAMYDIA PROBE AMP: GC Probe RNA: POSITIVE — AB

## 2013-04-11 ENCOUNTER — Telehealth (HOSPITAL_COMMUNITY): Payer: Self-pay | Admitting: Emergency Medicine

## 2013-04-11 NOTE — ED Notes (Signed)
+   Gonorrhea  Patient treated with Rocephin And Zithromax-[facility] faxed  

## 2013-04-12 ENCOUNTER — Telehealth (HOSPITAL_COMMUNITY): Payer: Self-pay | Admitting: Emergency Medicine

## 2013-04-13 ENCOUNTER — Telehealth (HOSPITAL_COMMUNITY): Payer: Self-pay | Admitting: Emergency Medicine

## 2013-04-14 ENCOUNTER — Telehealth (HOSPITAL_COMMUNITY): Payer: Self-pay | Admitting: Emergency Medicine

## 2013-04-14 NOTE — ED Notes (Signed)
Unable to contact patient via phone. Sent letter. °

## 2013-05-19 ENCOUNTER — Telehealth (HOSPITAL_COMMUNITY): Payer: Self-pay | Admitting: Emergency Medicine

## 2013-05-19 NOTE — ED Notes (Signed)
No response to letter sent after 30 days. Chart sent to Medical Records. °

## 2013-12-29 ENCOUNTER — Emergency Department (HOSPITAL_COMMUNITY)
Admission: EM | Admit: 2013-12-29 | Discharge: 2013-12-29 | Disposition: A | Discharge status: Home / Self Care | Payer: BC Managed Care – PPO | Attending: Emergency Medicine | Admitting: Emergency Medicine

## 2013-12-29 ENCOUNTER — Encounter (HOSPITAL_COMMUNITY): Payer: Self-pay | Admitting: Emergency Medicine

## 2013-12-29 ENCOUNTER — Emergency Department (HOSPITAL_COMMUNITY): Payer: BC Managed Care – PPO

## 2013-12-29 DIAGNOSIS — S93409A Sprain of unspecified ligament of unspecified ankle, initial encounter: Secondary | ICD-10-CM | POA: Insufficient documentation

## 2013-12-29 DIAGNOSIS — Y9389 Activity, other specified: Secondary | ICD-10-CM | POA: Insufficient documentation

## 2013-12-29 DIAGNOSIS — F17200 Nicotine dependence, unspecified, uncomplicated: Secondary | ICD-10-CM | POA: Insufficient documentation

## 2013-12-29 DIAGNOSIS — X500XXA Overexertion from strenuous movement or load, initial encounter: Secondary | ICD-10-CM | POA: Insufficient documentation

## 2013-12-29 DIAGNOSIS — Y9289 Other specified places as the place of occurrence of the external cause: Secondary | ICD-10-CM | POA: Insufficient documentation

## 2013-12-29 DIAGNOSIS — S93401A Sprain of unspecified ligament of right ankle, initial encounter: Secondary | ICD-10-CM

## 2013-12-29 DIAGNOSIS — S0990XA Unspecified injury of head, initial encounter: Secondary | ICD-10-CM | POA: Insufficient documentation

## 2013-12-29 DIAGNOSIS — Z8669 Personal history of other diseases of the nervous system and sense organs: Secondary | ICD-10-CM | POA: Insufficient documentation

## 2013-12-29 MED ORDER — OXYCODONE-ACETAMINOPHEN 5-325 MG PO TABS
2.0000 | ORAL_TABLET | Freq: Once | ORAL | Status: AC
  Administered 2013-12-29: 2 via ORAL
  Filled 2013-12-29: qty 2

## 2013-12-29 MED ORDER — ONDANSETRON 8 MG PO TBDP
8.0000 mg | ORAL_TABLET | Freq: Once | ORAL | Status: AC
  Administered 2013-12-29: 8 mg via ORAL

## 2013-12-29 MED ORDER — HYDROCODONE-ACETAMINOPHEN 5-325 MG PO TABS: 2.0000 | ORAL_TABLET | ORAL | Status: DC | PRN

## 2013-12-29 MED ORDER — IBUPROFEN 800 MG PO TABS: 800.0000 mg | ORAL_TABLET | Freq: Three times a day (TID) | ORAL | Status: DC

## 2013-12-29 MED ORDER — ONDANSETRON 8 MG PO TBDP
ORAL_TABLET | ORAL | Status: DC
Start: 2013-12-29 — End: 2013-12-30
  Filled 2013-12-29: qty 1

## 2013-12-29 NOTE — ED Notes (Signed)
C/O nausea.

## 2013-12-29 NOTE — ED Provider Notes (Signed)
CSN: [id number]     Arrival date & time 12/29/13  2032 History  This chart was scribed for Glynn Octave, MD by Bennett Scrape, ED Scribe. This patient was seen in room [facility]14[facility]14 and the patient's care was started at 9:02 PM.   Chief Complaint  Patient presents with  . Ankle Pain     The history is provided by the patient. No language interpreter was used.    HPI Comments: Kevin Moreno is a 48 y.o. male who presents to the Emergency Department complaining of right ankle pain that started suddenly PTA. Pt states that he slipped on ice in his driveway but did not fall. His right ankle rolled inward at the time. He went to take his boot off and felt a "pop" along the lateral foot with immediate pain. He localizes his pain from the right big toe to the medial aspect of the foot although the pain radiates throughout the entire foot with some mild numbness of the right big toe. He c/o a HA currently but denies any head trauma. He denies any CP, abdominal pain, back pain or neck pain. He denies being on any medications daily. He denies any chronic medical conditions.    Past Medical History  Diagnosis Date  . Dizzy spells   . Cataract    Past Surgical History  Procedure Laterality Date  . Appendectomy     Family History  Problem Relation Age of Onset  . Diabetes Mother   . Hypertension Mother   . Cancer Other    History  Substance Use Topics  . Smoking status: Current Every Day Smoker -- 0.50 packs/day    Types: Cigarettes  . Smokeless tobacco: Never Used  . Alcohol Use: No    Review of Systems A complete 10 system review of systems was obtained and all systems are negative except as noted in the HPI and PMH.     Allergies  Review of patient's allergies indicates no known allergies.  Home Medications   Current Outpatient Rx  Name  Route  Sig  Dispense  Refill  . HYDROcodone-acetaminophen (NORCO/VICODIN) 5-325 MG per tablet   Oral   Take 2 tablets by mouth  every 4 (four) hours as needed.   10 tablet   0   . HYDROcodone-acetaminophen (NORCO/VICODIN) 5-325 MG per tablet   Oral   Take 2 tablets by mouth every 4 (four) hours as needed.   6 tablet   0   . ibuprofen (ADVIL,MOTRIN) 800 MG tablet   Oral   Take 1 tablet (800 mg total) by mouth 3 (three) times daily.   21 tablet   0    Triage Vitals: BP 119/62  Pulse 74  Temp(Src) 97.4 F (36.3 C) (Oral)  Resp 20  Ht 5\' 6"  (1.676 m)  Wt 159 lb (72.122 kg)  BMI 25.68 kg/m2  SpO2 100%  Physical Exam  Nursing note and vitals reviewed. Constitutional: He is oriented to person, place, and time. He appears well-developed and well-nourished. No distress.  HENT:  Head: Normocephalic and atraumatic.  Mouth/Throat: Oropharynx is clear and moist. No oropharyngeal exudate.  Eyes: Conjunctivae and EOM are normal. Pupils are equal, round, and reactive to light. Right eye exhibits no discharge.  Neck: Normal range of motion. Neck supple. No tracheal deviation present.  Cardiovascular: Normal rate.   Pulmonary/Chest: Effort normal. No respiratory distress.  Abdominal: Soft. There is no tenderness. There is no rebound and no guarding.  Musculoskeletal: He exhibits tenderness. He exhibits  no edema.  Intact DP and PT pulse in the right foot, diffuse tenderness over the lateral and medial malleolus with no effusion, achilles is intact, no proximal fibular tenderness, no C, T or L spine tenderness   Neurological: He is alert and oriented to person, place, and time. No cranial nerve deficit. He exhibits normal muscle tone. Coordination normal.  Skin: Skin is warm and dry.  Psychiatric: He has a normal mood and affect. His behavior is normal.    ED Course  Procedures (including critical care time)  Medications  oxyCODONE-acetaminophen (PERCOCET/ROXICET) 5-325 MG per tablet 2 tablet (2 tablets Oral Given 12/29/13 2123)  ondansetron (ZOFRAN-ODT) disintegrating tablet 8 mg (8 mg Oral Given 12/29/13 2135)     DIAGNOSTIC STUDIES: Oxygen Saturation is 100% on RA, normal by my interpretation.    COORDINATION OF CARE: 9:07 PM-Discussed treatment plan which includes pain medications and x-rays of the right foot with pt at bedside and pt agreed to plan.   9:40 PM- Informed pt of negative x-rays. Discussed discharge plan which includes antiinflammatories and pain medications with pt and pt agreed to plan. Also advised pt to follow up as needed and pt agreed. Addressed symptoms to return for with pt.   Labs Review Labs Reviewed - No data to display Imaging Review Dg Ankle Complete Right  12/29/2013   CLINICAL DATA:  Foot pain, lateral right ankle pain, fell today  EXAM: RIGHT ANKLE - COMPLETE 3+ VIEW  COMPARISON:  None  FINDINGS: Osseous mineralization normal.  Joint spaces preserved.  No acute fracture, dislocation or bone destruction.  IMPRESSION: Normal exam.   Electronically Signed   By: Ulyses SouthwardMark  Boles M.D.   On: 12/29/2013 21:31   Dg Foot Complete Right  12/29/2013   CLINICAL DATA:  Foot pain, lateral ankle pain, fell today  EXAM: RIGHT FOOT COMPLETE - 3+ VIEW  COMPARISON:  None  FINDINGS: Osseous mineralization normal.  Minimal degenerative changes at first MTP joint.  Remaining joint spaces preserved.  Percocet artifacts at forefoot.  No acute fracture, dislocation, or bone destruction.  IMPRESSION: No acute osseous abnormalities.  Degenerative changes first MTP joint.   Electronically Signed   By: Ulyses SouthwardMark  Boles M.D.   On: 12/29/2013 21:32    EKG Interpretation   None       MDM   Final diagnoses:  Right ankle sprain   Twisted her right ankle on the ice. Denies hitting head or losing consciousness. Denies any neck, back, chest or abdominal pain. No focal weakness, numbness or tingling.  Diffuse tenderness to right ankle without evidence of edema or deformity. Intact DP and PT pulse. Intact achilles tendon.  X-rays negative for fractures or dislocations. Patient treated with pain  medication in the ED. Will be instructed on RICE therapy, given ASO, crutches, follow up with ortho.  I personally performed the services described in this documentation, which was scribed in my presence. The recorded information has been reviewed and is accurate.      Glynn OctaveStephen Indira Sorenson, MD 12/30/13 [phone]

## 2013-12-29 NOTE — Discharge Instructions (Signed)

## 2013-12-29 NOTE — ED Notes (Addendum)
Slipped on ice 3 hours ago.  R foot slipped outward, but he did not fall.  States foot and ankle was tender, but upon removing his boot, he felt a "pop" and severe, sharp pain developed.  Stated he tried ice on site which made it hurt more.  Pain is more severe on lateral aspect of ankle, extending into foot and toes.  States great toe is numb and he can't wiggle toes.  Site elevated on pillow and icepack applied.

## 2013-12-29 NOTE — ED Notes (Signed)
Patient reported during triage that he has "blackout spells". Patient then closed eyes and stopped responding to questions. Patient pulled away from painful stimulus and still continued to hold head upright when not responding to questions with eyes closed. Patient then stated, "I have dizzy spells."

## 2013-12-29 NOTE — ED Notes (Addendum)
Patient complaining of right ankle pain after sliding on ice.

## 2013-12-29 NOTE — ED Notes (Signed)
Patient with no complaints at this time. Respirations even and unlabored. Skin warm/dry. Discharge instructions reviewed with patient at this time. Patient given opportunity to voice concerns/ask questions. Patient discharged at this time and left Emergency Department with steady gait.   

## 2013-12-31 MED FILL — Hydrocodone-Acetaminophen Tab 5-325 MG: ORAL | Qty: 6 | Status: AC

## 2014-03-11 ENCOUNTER — Emergency Department (HOSPITAL_COMMUNITY)
Admission: EM | Admit: 2014-03-11 | Discharge: 2014-03-11 | Disposition: A | Discharge status: Home / Self Care | Payer: BC Managed Care – PPO | Attending: Emergency Medicine | Admitting: Emergency Medicine

## 2014-03-11 ENCOUNTER — Encounter (HOSPITAL_COMMUNITY): Payer: Self-pay | Admitting: Emergency Medicine

## 2014-03-11 DIAGNOSIS — Z8669 Personal history of other diseases of the nervous system and sense organs: Secondary | ICD-10-CM | POA: Insufficient documentation

## 2014-03-11 DIAGNOSIS — F17200 Nicotine dependence, unspecified, uncomplicated: Secondary | ICD-10-CM | POA: Insufficient documentation

## 2014-03-11 DIAGNOSIS — R369 Urethral discharge, unspecified: Secondary | ICD-10-CM | POA: Insufficient documentation

## 2014-03-11 DIAGNOSIS — N342 Other urethritis: Secondary | ICD-10-CM | POA: Insufficient documentation

## 2014-03-11 DIAGNOSIS — N390 Urinary tract infection, site not specified: Secondary | ICD-10-CM | POA: Insufficient documentation

## 2014-03-11 DIAGNOSIS — N400 Enlarged prostate without lower urinary tract symptoms: Secondary | ICD-10-CM | POA: Insufficient documentation

## 2014-03-11 LAB — URINALYSIS, ROUTINE W REFLEX MICROSCOPIC
BILIRUBIN URINE: NEGATIVE
Glucose, UA: NEGATIVE mg/dL
KETONES UR: NEGATIVE mg/dL
NITRITE: NEGATIVE
PH: 6 (ref 5.0–8.0)
Protein, ur: NEGATIVE mg/dL
Urobilinogen, UA: 0.2 mg/dL (ref 0.0–1.0)

## 2014-03-11 LAB — URINE MICROSCOPIC-ADD ON

## 2014-03-11 LAB — RPR

## 2014-03-11 MED ORDER — IBUPROFEN 800 MG PO TABS: 800.0000 mg | ORAL_TABLET | Freq: Three times a day (TID) | ORAL | Status: DC

## 2014-03-11 MED ORDER — CIPROFLOXACIN HCL 500 MG PO TABS: 500.0000 mg | ORAL_TABLET | Freq: Two times a day (BID) | ORAL | Status: DC

## 2014-03-11 MED ORDER — AZITHROMYCIN 250 MG PO TABS
1000.0000 mg | ORAL_TABLET | Freq: Once | ORAL | Status: AC
  Administered 2014-03-11: 1000 mg via ORAL
  Filled 2014-03-11: qty 4

## 2014-03-11 MED ORDER — CEFTRIAXONE SODIUM 250 MG IJ SOLR
250.0000 mg | Freq: Once | INTRAMUSCULAR | Status: AC
  Administered 2014-03-11: 250 mg via INTRAMUSCULAR
  Filled 2014-03-11: qty 250

## 2014-03-11 MED ORDER — STERILE WATER FOR INJECTION IJ SOLN
INTRAMUSCULAR | Status: AC
  Administered 2014-03-11: 2.1 mL
  Filled 2014-03-11: qty 10

## 2014-03-11 NOTE — ED Provider Notes (Signed)
Medical screening examination/treatment/procedure(s) were performed by non-physician practitioner and as supervising physician I was immediately available for consultation/collaboration.   EKG Interpretation None      Devoria AlbeIva Leyland Kenna, MD, Armando GangFACEP   Ward GivensIva L Wallis Spizzirri, MD 03/11/14 [phone]

## 2014-03-11 NOTE — Discharge Instructions (Signed)
Your examination is consistent with urethritis. Your labs suggest a possible urinary tract infection. A culture of both been sent to the lab. Please use Cipro 2 times daily with food until all taken. Please refrain from all sexual activity for the next 7 days. Please have your urine rechecked in 7-10 days by your primary doctor or a local health department. Urethritis, Adult Urethritis is an inflammation of the tube through which urine exits your bladder (urethra).  CAUSES Urethritis is often caused by an infection in your urethra. The infection can be viral, like herpes. The infection can also be bacterial, like gonorrhea. RISK FACTORS Risk factors of urethritis include:  Having sex without using a condom.  Having multiple sexual partners.  Having poor hygiene. SIGNS AND SYMPTOMS Symptoms of urethritis are less noticeable in women than in men. These symptoms include:  Burning feeling when you urinate (dysuria).  Discharge from your urethra.  Blood in your urine (hematuria).  Urinating more than usual. DIAGNOSIS  To confirm a diagnosis of urethritis, your health care provider will do the following:  Ask about your sexual history.  Perform a physical exam.  Have you provide a sample of your urine for lab testing.  Use a cotton swab to gently collect a sample from your urethra for lab testing. TREATMENT  It is important to treat urethritis. Depending on the cause, untreated urethritis may lead to serious genital infections and possibly infertility. Urethritis caused by a bacterial infection is treated with antibiotics. All sexual partners must be treated.  HOME CARE INSTRUCTIONS  Do not have sex until the test results are known and treatment is completed, even if your symptoms go away before you finish treatment.  Finish all medicines that you are prescribed. SEEK MEDICAL CARE IF:   Your symptoms are not improved in 3 days.  Your symptoms are getting worse.  You develop  abdominal pain or pelvic pain (in women).  You develop joint pain. SEEK IMMEDIATE MEDICAL CARE IF:   You have a fever with a temperature of 101.67F (38.8C) or greater.  You have severe pain in the belly, back, or side.  You have repeated vomiting. Document Released: 04/20/2001 Document Revised: 08/15/2013 Document Reviewed: 06/25/2013 [facility] Patient Information 2014 [place], [place].  Urinary Tract Infection A urinary tract infection (UTI) can occur any place along the urinary tract. The tract includes the kidneys, ureters, bladder, and urethra. A type of germ called bacteria often causes a UTI. UTIs are often helped with antibiotic medicine.  HOME CARE   If given, take antibiotics as told by your doctor. Finish them even if you start to feel better.  Drink enough fluids to keep your pee (urine) clear or pale yellow.  Avoid tea, drinks with caffeine, and bubbly (carbonated) drinks.  Pee often. Avoid holding your pee in for a long time.  Pee before and after having sex (intercourse).  Wipe from front to back after you poop (bowel movement) if you are a woman. Use each tissue only once. GET HELP RIGHT AWAY IF:   You have back pain.  You have lower belly (abdominal) pain.  You have chills.  You feel sick to your stomach (nauseous).  You throw up (vomit).  Your burning or discomfort with peeing does not go away.  You have a fever.  Your symptoms are not better in 3 days. MAKE SURE YOU:   Understand these instructions.  Will watch your condition.  Will get help right away if you are not doing well  or get worse. Document Released: 04/12/2008 Document Revised: 07/19/2012 Document Reviewed: 05/25/2012 [facility] Patient Information 2014 [place], [place].

## 2014-03-11 NOTE — ED Notes (Signed)
C/o increase white discharge from penis over last week per pt.

## 2014-03-11 NOTE — ED Provider Notes (Signed)
CSN: [id number]     Arrival date & time 03/11/14  [id number] History   First MD Initiated Contact with Patient 03/11/14 0840     Chief Complaint  Patient presents with  . SEXUALLY TRANSMITTED DISEASE     (Consider location/radiation/quality/duration/timing/severity/associated sxs/prior Treatment) HPI Comments: Pt states he noted some white discharge from the penis last week. Last night and today he noted spots in his underwear.  No dysuria. No fever. No rash. No unprotected intercourse per pt. No hx of problem with urethra or prostrate. He has not taken any medications for this problem.  The history is provided by the patient.    Past Medical History  Diagnosis Date  . Dizzy spells   . Cataract    Past Surgical History  Procedure Laterality Date  . Appendectomy     Family History  Problem Relation Age of Onset  . Diabetes Mother   . Hypertension Mother   . Cancer Other    History  Substance Use Topics  . Smoking status: Current Every Day Smoker -- 0.50 packs/day    Types: Cigarettes  . Smokeless tobacco: Never Used  . Alcohol Use: No    Review of Systems  Constitutional: Negative for activity change.       All ROS Neg except as noted in HPI  HENT: Negative for nosebleeds.   Eyes: Negative for photophobia and discharge.  Respiratory: Negative for cough, shortness of breath and wheezing.   Cardiovascular: Negative for chest pain and palpitations.  Gastrointestinal: Negative for abdominal pain and blood in stool.  Genitourinary: Positive for discharge. Negative for dysuria, frequency, hematuria, flank pain, scrotal swelling, penile pain and testicular pain.  Musculoskeletal: Negative for arthralgias, back pain and neck pain.  Skin: Negative.   Neurological: Negative for dizziness, seizures and speech difficulty.  Psychiatric/Behavioral: Negative for hallucinations and confusion.      Allergies  Review of patient's allergies indicates no known allergies.  Home  Medications   Prior to Admission medications   Medication Sig Start Date End Date Taking? Authorizing Provider  acetaminophen (TYLENOL) 500 MG tablet Take 1,000 mg by mouth daily as needed for headache.   Yes Historical Provider, MD   BP 119/80  Pulse 80  Temp(Src) 98.1 F (36.7 C) (Oral)  Resp 16  Ht 5\' 6"  (1.676 m)  Wt 160 lb (72.576 kg)  BMI 25.84 kg/m2  SpO2 99% Physical Exam  Nursing note and vitals reviewed. Constitutional: He is oriented to person, place, and time. He appears well-developed and well-nourished.  Non-toxic appearance.  HENT:  Head: Normocephalic.  Right Ear: Tympanic membrane and external ear normal.  Left Ear: Tympanic membrane and external ear normal.  Eyes: EOM and lids are normal. Pupils are equal, round, and reactive to light.  Neck: Normal range of motion. Neck supple. Carotid bruit is not present.  Cardiovascular: Normal rate, regular rhythm, normal heart sounds, intact distal pulses and normal pulses.   Pulmonary/Chest: Breath sounds normal. No respiratory distress.  Abdominal: Soft. Bowel sounds are normal. There is no tenderness. There is no guarding. Hernia confirmed negative in the right inguinal area and confirmed negative in the left inguinal area.  Genitourinary: Rectum normal and testes normal. Rectal exam shows no external hemorrhoid and no fissure. Prostate is enlarged. Prostate is not tender. Circumcised. Discharge found.  Musculoskeletal: Normal range of motion.  Lymphadenopathy:       Head (right side): No submandibular adenopathy present.       Head (left side): No submandibular adenopathy  present.    He has no cervical adenopathy.  Neurological: He is alert and oriented to person, place, and time. He has normal strength. No cranial nerve deficit or sensory deficit.  Skin: Skin is warm and dry.  Psychiatric: He has a normal mood and affect. His speech is normal.    ED Course  Procedures (including critical care time) Labs  Review Labs Reviewed - No data to display  Imaging Review No results found.   EKG Interpretation None      MDM UA reveals large amount of HGB with small leukocytes and TMTC wbc and rbc. Pt treated in the ED with IM rocephin and oral zithromax. Culture of urine sent to the lab. Urethral culture sent to the lab. Rx for cipro given for the UTI. Pt to have ua rechecked in 7 to 10 days.   Final diagnoses:  None    *I have reviewed nursing notes, vital signs, and all appropriate lab and imaging results for this patient.Kathie Dike**    Mitra Duling M Jezabelle Chisolm, PA-C 03/11/14 1714

## 2014-03-13 LAB — GC/CHLAMYDIA PROBE AMP: CT Probe RNA: UNDETERMINED

## 2014-03-14 LAB — URINE CULTURE
COLONY COUNT: NO GROWTH
CULTURE: NO GROWTH

## 2015-06-26 ENCOUNTER — Emergency Department (HOSPITAL_COMMUNITY): Payer: Self-pay

## 2015-06-26 ENCOUNTER — Emergency Department (HOSPITAL_COMMUNITY)
Admission: EM | Admit: 2015-06-26 | Discharge: 2015-06-27 | Disposition: A | Discharge status: Home / Self Care | Payer: Self-pay | Attending: Physician Assistant | Admitting: Physician Assistant

## 2015-06-26 ENCOUNTER — Encounter (HOSPITAL_COMMUNITY): Payer: Self-pay

## 2015-06-26 DIAGNOSIS — Z720 Tobacco use: Secondary | ICD-10-CM | POA: Insufficient documentation

## 2015-06-26 DIAGNOSIS — B349 Viral infection, unspecified: Secondary | ICD-10-CM | POA: Insufficient documentation

## 2015-06-26 DIAGNOSIS — H269 Unspecified cataract: Secondary | ICD-10-CM | POA: Insufficient documentation

## 2015-06-26 LAB — COMPREHENSIVE METABOLIC PANEL
ALBUMIN: 3.9 g/dL (ref 3.5–5.0)
ALK PHOS: 87 U/L (ref 38–126)
ALT: 27 U/L (ref 17–63)
ANION GAP: 8 (ref 5–15)
AST: 22 U/L (ref 15–41)
BUN: 9 mg/dL (ref 6–20)
CALCIUM: 8.6 mg/dL — AB (ref 8.9–10.3)
CHLORIDE: 102 mmol/L (ref 101–111)
CO2: 24 mmol/L (ref 22–32)
CREATININE: 1.1 mg/dL (ref 0.61–1.24)
GFR calc Af Amer: >60 mL/min (ref >60)
GFR calc non Af Amer: >60 mL/min (ref >60)
GLUCOSE: 118 mg/dL — AB (ref 65–99)
Potassium: 3 mmol/L — ABNORMAL LOW (ref 3.5–5.1)
SODIUM: 134 mmol/L — AB (ref 135–145)
Total Bilirubin: 0.9 mg/dL (ref 0.3–1.2)
Total Protein: 7.6 g/dL (ref 6.5–8.1)

## 2015-06-26 LAB — CBC WITH DIFFERENTIAL/PLATELET
BASOS PCT: 0 % (ref 0–1)
Basophils Absolute: 0 10*3/uL (ref 0.0–0.1)
Eosinophils Absolute: 0 10*3/uL (ref 0.0–0.7)
Eosinophils Relative: 0 % (ref 0–5)
HEMATOCRIT: 41.5 % (ref 39.0–52.0)
Hemoglobin: 14.1 g/dL (ref 13.0–17.0)
LYMPHS ABS: 1 10*3/uL (ref 0.7–4.0)
Lymphocytes Relative: 14 % (ref 12–46)
MCH: 30.1 pg (ref 26.0–34.0)
MCHC: 34 g/dL (ref 30.0–36.0)
MCV: 88.7 fL (ref 78.0–100.0)
MONO ABS: 0.8 10*3/uL (ref 0.1–1.0)
MONOS PCT: 11 % (ref 3–12)
NEUTROS ABS: 5.1 10*3/uL (ref 1.7–7.7)
Neutrophils Relative %: 75 % (ref 43–77)
Platelets: 162 10*3/uL (ref 150–400)
RBC: 4.68 MIL/uL (ref 4.22–5.81)
RDW: 12.4 % (ref 11.5–15.5)
WBC: 6.9 10*3/uL (ref 4.0–10.5)

## 2015-06-26 LAB — LIPASE, BLOOD: Lipase: 13 U/L — ABNORMAL LOW (ref 22–51)

## 2015-06-26 LAB — URINALYSIS, ROUTINE W REFLEX MICROSCOPIC
Bilirubin Urine: NEGATIVE
GLUCOSE, UA: NEGATIVE mg/dL
Hgb urine dipstick: NEGATIVE
KETONES UR: NEGATIVE mg/dL
LEUKOCYTES UA: NEGATIVE
NITRITE: NEGATIVE
PROTEIN: NEGATIVE mg/dL
Specific Gravity, Urine: 1.01 (ref 1.005–1.030)
UROBILINOGEN UA: 0.2 mg/dL (ref 0.0–1.0)
pH: 6.5 (ref 5.0–8.0)

## 2015-06-26 MED ORDER — SODIUM CHLORIDE 0.9 % IV BOLUS (SEPSIS)
1000.0000 mL | Freq: Once | INTRAVENOUS | Status: AC
Start: 2015-06-26 — End: 2015-06-27
  Administered 2015-06-26: 1000 mL via INTRAVENOUS

## 2015-06-26 MED ORDER — IOHEXOL 300 MG/ML  SOLN
100.0000 mL | Freq: Once | INTRAMUSCULAR | Status: AC | PRN
  Administered 2015-06-26: 100 mL via INTRAVENOUS

## 2015-06-26 MED ORDER — SODIUM CHLORIDE 0.9 % IV BOLUS (SEPSIS)
1000.0000 mL | Freq: Once | INTRAVENOUS | Status: AC
Start: 2015-06-26 — End: 2015-06-26
  Administered 2015-06-26: 1000 mL via INTRAVENOUS

## 2015-06-26 MED ORDER — IOHEXOL 300 MG/ML  SOLN
50.0000 mL | Freq: Once | INTRAMUSCULAR | Status: AC | PRN
  Administered 2015-06-26: 50 mL via ORAL

## 2015-06-26 MED ORDER — POTASSIUM CHLORIDE CRYS ER 20 MEQ PO TBCR
40.0000 meq | EXTENDED_RELEASE_TABLET | Freq: Once | ORAL | Status: AC
  Administered 2015-06-26: 40 meq via ORAL
  Filled 2015-06-26: qty 2

## 2015-06-26 NOTE — ED Notes (Signed)
Patient transported to CT. EKG will be done upon return to room.

## 2015-06-26 NOTE — ED Notes (Signed)
Patient c/o headache that started 3 days ago with body aches, dizziness,  and "feeling hot"

## 2015-06-26 NOTE — ED Provider Notes (Signed)
CSN: [id number]     Arrival date & time 06/26/15  2105 History  This chart was scribed for Ladonya Jerkins Randall An, MD by Doreatha Martin, ED Scribe. This patient was seen in room APA16A/APA16A and the patient's care was started at 10:06 PM.     Chief Complaint  Patient presents with  . Headache   The history is provided by the patient. No language interpreter was used.    HPI Comments: Kevin Moreno is a 49 y.o. male who presents to the Emergency Department complaining of moderate generalized fatigue onset yesterday with associated subjective fever, abdominal pain, generalized myalgias, chills, diaphoresis, hot spells. No recent tick bites, outdoor exposure, no dog at home. He has taken Tylenol PTA with mild relief (last dose this afternoon).  He denies vomiting, sore throat, diarrhea, nausea, cough, dysuria, rash.  Past Medical History  Diagnosis Date  . Dizzy spells   . Cataract    Past Surgical History  Procedure Laterality Date  . Appendectomy     Family History  Problem Relation Age of Onset  . Diabetes Mother   . Hypertension Mother   . Cancer Other    Social History  Substance Use Topics  . Smoking status: Current Every Day Smoker -- 0.50 packs/day    Types: Cigarettes  . Smokeless tobacco: Never Used  . Alcohol Use: No    Review of Systems  Constitutional: Positive for fever, chills, diaphoresis and fatigue.  HENT: Negative for sore throat.   Respiratory: Negative for cough.   Gastrointestinal: Positive for abdominal pain. Negative for nausea, vomiting and diarrhea.  Genitourinary: Negative for dysuria.  Musculoskeletal: Positive for myalgias.  Skin: Negative for rash.  All other systems reviewed and are negative.  Allergies  Review of patient's allergies indicates no known allergies.  Home Medications   Prior to Admission medications   Medication Sig Start Date End Date Taking? Authorizing Provider  acetaminophen (TYLENOL) 500 MG tablet Take 1,000 mg by  mouth daily as needed for mild pain or headache.    Yes Historical Provider, MD  ciprofloxacin (CIPRO) 500 MG tablet Take 1 tablet (500 mg total) by mouth 2 (two) times daily. Patient not taking: Reported on 06/26/2015 03/11/14   Ivery Quale, PA-C  ibuprofen (ADVIL,MOTRIN) 800 MG tablet Take 1 tablet (800 mg total) by mouth 3 (three) times daily. Patient not taking: Reported on 06/26/2015 03/11/14   Ivery Quale, PA-C   BP 130/78 mmHg  Pulse 87  Temp(Src) 99.3 F (37.4 C) (Oral)  Resp 16  Ht  (1.676 m)  Wt 170 lb (77.111 kg)  BMI 27.45 kg/m2  SpO2 98% Physical Exam  Constitutional: He is oriented to person, place, and time. He appears well-developed and well-nourished.  HENT:  Head: Normocephalic and atraumatic.  Eyes: Conjunctivae and EOM are normal. Pupils are equal, round, and reactive to light.  Neck: Normal range of motion. Neck supple.  Cardiovascular: Normal rate.   Pulmonary/Chest: Effort normal and breath sounds normal. No respiratory distress.  Lungs CTA.   Abdominal: He exhibits no distension.  Musculoskeletal: Normal range of motion.  Neurological: He is alert and oriented to person, place, and time.  Skin: Skin is warm and dry.  Psychiatric: He has a normal mood and affect. His behavior is normal.  Nursing note and vitals reviewed.   ED Course  Procedures (including critical care time) DIAGNOSTIC STUDIES: Oxygen Saturation is 98% on RA, normal by my interpretation.    COORDINATION OF CARE: 10:11 PM Discussed treatment  plan with pt at bedside and pt agreed to plan.   Labs Review Labs Reviewed  COMPREHENSIVE METABOLIC PANEL - Abnormal; Notable for the following:    Sodium 134 (*)    Potassium 3.0 (*)    Glucose, Bld 118 (*)    Calcium 8.6 (*)    All other components within normal limits  LIPASE, BLOOD - Abnormal; Notable for the following:    Lipase 13 (*)    All other components within normal limits  URINE CULTURE  CBC WITH DIFFERENTIAL/PLATELET   URINALYSIS, ROUTINE W REFLEX MICROSCOPIC (NOT AT [facility])    Imaging Review Dg Chest 2 View  06/26/2015   CLINICAL DATA:  Chest pain  EXAM: CHEST  2 VIEW  COMPARISON:  None currently available  FINDINGS: Linear opacities in the lower lungs consistent with atelectasis. There is no edema, consolidation, effusion, or pneumothorax. Normal heart size and mediastinal contours.  IMPRESSION: Mild bibasilar atelectasis.   Electronically Signed   By: Marnee Spring M.D.   On: 06/26/2015 22:49   I have personally reviewed and evaluated these images and lab results as part of my medical decision-making.   EKG Interpretation None      MDM   Final diagnoses:  None    Patient is a 49 year old male here in the emergency department with his mother after 2 days of feeling unwell. Patient states that he sometimes had fevers sometimes felt cold. No recent tick exposures. No other focal symptoms symptoms. He does complain of mild abdominal pain.  Cataract in his left eye.  We will get a CAT scan given his abdominal pain. However he really has no focality to his exam. Vital signs are normal. Physical exam is relatively normal. Likely viral syndrome.  We will give fluids, replete potassium. If CAT scan is normal discharge home with follow up instructions.    I personally performed the services described in this documentation, which was scribed in my presence. The recorded information has been reviewed and is accurate.  Doye Montilla Randall An, MD 06/27/15 [phone]

## 2015-06-27 NOTE — ED Notes (Signed)
Patient states no pain but stomach muscles feels sore.

## 2015-06-27 NOTE — Discharge Instructions (Signed)
We are unsure what is causing you to feel both hot and cold. It may be that you have a virus. Please follow-up with her regular physician as needed. Please return if he had a high fever.  You have a little nodule on your CAT of your lung.  You should have another CT in 6 months to make sure that it has gotten better.

## 2015-06-28 LAB — URINE CULTURE: Culture: NO GROWTH

## 2015-07-27 ENCOUNTER — Emergency Department (HOSPITAL_COMMUNITY)
Admission: EM | Admit: 2015-07-27 | Discharge: 2015-07-27 | Disposition: A | Discharge status: Home / Self Care | Payer: Self-pay | Attending: Emergency Medicine | Admitting: Emergency Medicine

## 2015-07-27 ENCOUNTER — Encounter (HOSPITAL_COMMUNITY): Payer: Self-pay | Admitting: Emergency Medicine

## 2015-07-27 DIAGNOSIS — Z720 Tobacco use: Secondary | ICD-10-CM | POA: Insufficient documentation

## 2015-07-27 DIAGNOSIS — N390 Urinary tract infection, site not specified: Secondary | ICD-10-CM | POA: Insufficient documentation

## 2015-07-27 DIAGNOSIS — H269 Unspecified cataract: Secondary | ICD-10-CM | POA: Insufficient documentation

## 2015-07-27 LAB — COMPREHENSIVE METABOLIC PANEL
ALT: 34 U/L (ref 17–63)
AST: 30 U/L (ref 15–41)
Albumin: 3.8 g/dL (ref 3.5–5.0)
Alkaline Phosphatase: 79 U/L (ref 38–126)
Anion gap: 11 (ref 5–15)
BUN: 15 mg/dL (ref 6–20)
CHLORIDE: 96 mmol/L — AB (ref 101–111)
CO2: 26 mmol/L (ref 22–32)
Calcium: 9.1 mg/dL (ref 8.9–10.3)
Creatinine, Ser: 1.18 mg/dL (ref 0.61–1.24)
Glucose, Bld: 158 mg/dL — ABNORMAL HIGH (ref 65–99)
POTASSIUM: 3.4 mmol/L — AB (ref 3.5–5.1)
Sodium: 133 mmol/L — ABNORMAL LOW (ref 135–145)
Total Bilirubin: 1.2 mg/dL (ref 0.3–1.2)
Total Protein: 8.3 g/dL — ABNORMAL HIGH (ref 6.5–8.1)

## 2015-07-27 LAB — URINE MICROSCOPIC-ADD ON

## 2015-07-27 LAB — CBC WITH DIFFERENTIAL/PLATELET
Basophils Absolute: 0 10*3/uL (ref 0.0–0.1)
Basophils Relative: 0 %
Eosinophils Absolute: 0 10*3/uL (ref 0.0–0.7)
Eosinophils Relative: 0 %
HEMATOCRIT: 42.9 % (ref 39.0–52.0)
HEMOGLOBIN: 14.7 g/dL (ref 13.0–17.0)
LYMPHS ABS: 0.7 10*3/uL (ref 0.7–4.0)
LYMPHS PCT: 8 %
MCH: 30.2 pg (ref 26.0–34.0)
MCHC: 34.3 g/dL (ref 30.0–36.0)
MCV: 88.1 fL (ref 78.0–100.0)
Monocytes Absolute: 0.4 10*3/uL (ref 0.1–1.0)
Monocytes Relative: 5 %
NEUTROS ABS: 7.7 10*3/uL (ref 1.7–7.7)
NEUTROS PCT: 88 %
Platelets: 113 10*3/uL — ABNORMAL LOW (ref 150–400)
RBC: 4.87 MIL/uL (ref 4.22–5.81)
RDW: 12.6 % (ref 11.5–15.5)
WBC: 8.7 10*3/uL (ref 4.0–10.5)

## 2015-07-27 LAB — URINALYSIS, ROUTINE W REFLEX MICROSCOPIC
Glucose, UA: NEGATIVE mg/dL
Ketones, ur: 15 mg/dL — AB
LEUKOCYTES UA: NEGATIVE
NITRITE: NEGATIVE
PH: 6.5 (ref 5.0–8.0)
Protein, ur: >300 mg/dL — AB
SPECIFIC GRAVITY, URINE: 1.025 (ref 1.005–1.030)
Urobilinogen, UA: 1 mg/dL (ref 0.0–1.0)

## 2015-07-27 LAB — LIPASE, BLOOD: Lipase: <10 U/L — ABNORMAL LOW (ref 22–51)

## 2015-07-27 MED ORDER — CIPROFLOXACIN HCL 250 MG PO TABS
500.0000 mg | ORAL_TABLET | Freq: Once | ORAL | Status: AC
  Administered 2015-07-27: 500 mg via ORAL
  Filled 2015-07-27: qty 2

## 2015-07-27 MED ORDER — SODIUM CHLORIDE 0.9 % IV BOLUS (SEPSIS)
1000.0000 mL | Freq: Once | INTRAVENOUS | Status: AC
  Administered 2015-07-27: 1000 mL via INTRAVENOUS

## 2015-07-27 MED ORDER — CIPROFLOXACIN HCL 500 MG PO TABS: 500.0000 mg | ORAL_TABLET | Freq: Two times a day (BID) | ORAL | Status: DC

## 2015-07-27 MED ORDER — ONDANSETRON HCL 4 MG/2ML IJ SOLN
4.0000 mg | Freq: Once | INTRAMUSCULAR | Status: AC
  Administered 2015-07-27: 4 mg via INTRAVENOUS
  Filled 2015-07-27: qty 2

## 2015-07-27 MED ORDER — SODIUM CHLORIDE 0.9 % IV BOLUS (SEPSIS)
1000.0000 mL | Freq: Once | INTRAVENOUS | Status: AC
Start: 2015-07-27 — End: 2015-07-27
  Administered 2015-07-27: 1000 mL via INTRAVENOUS

## 2015-07-27 MED ORDER — ACETAMINOPHEN 500 MG PO TABS
1000.0000 mg | ORAL_TABLET | Freq: Once | ORAL | Status: AC
Start: 2015-07-27 — End: 2015-07-27
  Administered 2015-07-27: 1000 mg via ORAL
  Filled 2015-07-27: qty 2

## 2015-07-27 MED ORDER — MORPHINE SULFATE (PF) 4 MG/ML IV SOLN
4.0000 mg | Freq: Once | INTRAVENOUS | Status: AC
  Administered 2015-07-27: 4 mg via INTRAVENOUS
  Filled 2015-07-27: qty 1

## 2015-07-27 MED ORDER — PROMETHAZINE HCL 25 MG PO TABS: 25.0000 mg | ORAL_TABLET | Freq: Four times a day (QID) | ORAL | Status: DC | PRN

## 2015-07-27 NOTE — ED Provider Notes (Signed)
CSN: [id number]     Arrival date & time 07/27/15  1016 History  This chart was scribed for Donnetta Hutching, MD by Ronney Lion, ED Scribe. This patient was seen in room APA14/APA14 and the patient's care was started at 10:46 AM.   Chief Complaint  Patient presents with  . Fever  . Generalized Body Aches   The history is provided by the patient. No language interpreter was used.    HPI Comments: Kevin Moreno is a 49 y.o. male who presents to the Emergency Department complaining of fever, chills, and generalized myalgias that began 3 days ago after having eye surgery. Patient states he given a medicine following the surgery which he thinks is the cause of his symptoms, as he had a headache immediately after taking it. Patient also complains of 1 episode of vomiting 2 days ago, feeling unable to keep anything down, soreness in his epigastrium, dark urine, and cough. He states has been pushing fluids, Gatorade and water, but has continued to feel sore and generally weak. He states these pains are unusual. He denies otalgia.   Past Medical History  Diagnosis Date  . Dizzy spells   . Cataract    Past Surgical History  Procedure Laterality Date  . Appendectomy     Family History  Problem Relation Age of Onset  . Diabetes Mother   . Hypertension Mother   . Cancer Other    Social History  Substance Use Topics  . Smoking status: Current Every Day Smoker -- 0.50 packs/day    Types: Cigarettes  . Smokeless tobacco: Never Used  . Alcohol Use: No    Review of Systems  Constitutional: Positive for fever and chills.  HENT: Negative for ear pain.   Respiratory: Positive for cough.   Gastrointestinal: Positive for nausea, vomiting and abdominal pain (epigastric soreness).  Musculoskeletal: Positive for myalgias.  Neurological: Positive for weakness (generalized) and headaches.  All other systems reviewed and are negative.   Allergies  Review of patient's allergies indicates no known  allergies.  Home Medications   Prior to Admission medications   Medication Sig Start Date End Date Taking? Authorizing Provider  ibuprofen (ADVIL,MOTRIN) 200 MG tablet Take 600 mg by mouth every 6 (six) hours as needed for fever or mild pain.   Yes Historical Provider, MD  acetaminophen (TYLENOL) 500 MG tablet Take 1,000 mg by mouth daily as needed for mild pain or headache.     Historical Provider, MD  ciprofloxacin (CIPRO) 500 MG tablet Take 1 tablet (500 mg total) by mouth 2 (two) times daily. 07/27/15   Donnetta Hutching, MD  ibuprofen (ADVIL,MOTRIN) 800 MG tablet Take 1 tablet (800 mg total) by mouth 3 (three) times daily. Patient not taking: Reported on 06/26/2015 03/11/14   Ivery Quale, PA-C  promethazine (PHENERGAN) 25 MG tablet Take 1 tablet (25 mg total) by mouth every 6 (six) hours as needed. 07/27/15   Donnetta Hutching, MD   BP 133/81 mmHg  Pulse 114  Temp(Src) 100.1 F (37.8 C) (Oral)  Resp 18  Ht  (1.676 m)  Wt 160 lb (72.576 kg)  BMI 25.84 kg/m2  SpO2 96% Physical Exam  Constitutional: He is oriented to person, place, and time. He appears well-developed and well-nourished.  HENT:  Head: Normocephalic and atraumatic.  Eyes: Conjunctivae and EOM are normal. Pupils are equal, round, and reactive to light.  Neck: Normal range of motion. Neck supple.  Cardiovascular: Normal rate and regular rhythm.   Pulmonary/Chest: Effort normal  and breath sounds normal.  Abdominal: Soft. Bowel sounds are normal.  Musculoskeletal: Normal range of motion.  Neurological: He is alert and oriented to person, place, and time.  Skin: Skin is warm and dry.  Psychiatric: He has a normal mood and affect. His behavior is normal.  Nursing note and vitals reviewed.   ED Course  Procedures (including critical care time)  DIAGNOSTIC STUDIES: Oxygen Saturation is 98% on RA, normal by my interpretation.    COORDINATION OF CARE: 10:47 AM - Discussed treatment plan with pt at bedside which includes IV  fluids, pain medication, UA, and blood tests. Pt verbalized understanding and agreed to plan.    Labs Review Labs Reviewed  COMPREHENSIVE METABOLIC PANEL - Abnormal; Notable for the following:    Sodium 133 (*)    Potassium 3.4 (*)    Chloride 96 (*)    Glucose, Bld 158 (*)    Total Protein 8.3 (*)    All other components within normal limits  CBC WITH DIFFERENTIAL/PLATELET - Abnormal; Notable for the following:    Platelets 113 (*)    All other components within normal limits  LIPASE, BLOOD - Abnormal; Notable for the following:    Lipase <10 (*)    All other components within normal limits  URINALYSIS, ROUTINE W REFLEX MICROSCOPIC (NOT AT [facility]) - Abnormal; Notable for the following:    Color, Urine ORANGE (*)    APPearance HAZY (*)    Hgb urine dipstick LARGE (*)    Bilirubin Urine SMALL (*)    Ketones, ur 15 (*)    Protein, ur >300 (*)    All other components within normal limits  URINE MICROSCOPIC-ADD ON - Abnormal; Notable for the following:    Bacteria, UA MANY (*)    Casts GRANULAR CAST (*)    All other components within normal limits  URINE CULTURE    Imaging Review No results found. I have personally reviewed and evaluated these images and lab results as part of my medical decision-making.   EKG Interpretation None      MDM   Final diagnoses:  UTI (lower urinary tract infection)   Patient feels much better after 2-3 L of IV fluids. Urinalysis shows evidence of infection. Will start Cipro. Discharge medications Cipro 500 mg and Phenergan 25 mg. He has primary care follow-up.   I personally performed the services described in this documentation, which was scribed in my presence. The recorded information has been reviewed and is accurate.     Donnetta Hutching, MD 07/27/15 [phone]

## 2015-07-27 NOTE — Discharge Instructions (Signed)
You have a urinary tract infection. Increase fluids. Prescription for antibiotic and nausea medicine. Rest. Tylenol for fever or pain.  Follow-up your primary care doctor or return if worse

## 2015-07-27 NOTE — ED Notes (Signed)
Fever and body aches since Thursday.  Rates body aches 10/10, treated with Motrin with not relief.

## 2015-07-28 LAB — URINE CULTURE: Culture: 7000

## 2015-08-18 ENCOUNTER — Encounter (HOSPITAL_COMMUNITY): Payer: Self-pay | Admitting: Emergency Medicine

## 2015-08-18 ENCOUNTER — Emergency Department (HOSPITAL_COMMUNITY)
Admission: EM | Admit: 2015-08-18 | Discharge: 2015-08-18 | Disposition: A | Discharge status: Home / Self Care | Payer: Self-pay | Attending: Physician Assistant | Admitting: Physician Assistant

## 2015-08-18 DIAGNOSIS — Z792 Long term (current) use of antibiotics: Secondary | ICD-10-CM | POA: Insufficient documentation

## 2015-08-18 DIAGNOSIS — Z720 Tobacco use: Secondary | ICD-10-CM | POA: Insufficient documentation

## 2015-08-18 DIAGNOSIS — M545 Low back pain: Secondary | ICD-10-CM | POA: Insufficient documentation

## 2015-08-18 DIAGNOSIS — H269 Unspecified cataract: Secondary | ICD-10-CM | POA: Insufficient documentation

## 2015-08-18 LAB — URINALYSIS, ROUTINE W REFLEX MICROSCOPIC
BILIRUBIN URINE: NEGATIVE
Glucose, UA: NEGATIVE mg/dL
Hgb urine dipstick: NEGATIVE
KETONES UR: NEGATIVE mg/dL
LEUKOCYTES UA: NEGATIVE
NITRITE: NEGATIVE
PH: 5.5 (ref 5.0–8.0)
Specific Gravity, Urine: >1.03 — ABNORMAL HIGH (ref 1.005–1.030)
UROBILINOGEN UA: 0.2 mg/dL (ref 0.0–1.0)

## 2015-08-18 LAB — URINE MICROSCOPIC-ADD ON

## 2015-08-18 MED ORDER — DICLOFENAC SODIUM 50 MG PO TBEC
50.0000 mg | DELAYED_RELEASE_TABLET | Freq: Three times a day (TID) | ORAL | Status: DC
Start: 2015-08-18 — End: 2015-09-10

## 2015-08-18 MED ORDER — PREDNISONE 10 MG PO TABS: ORAL_TABLET | ORAL | Status: DC

## 2015-08-18 NOTE — ED Notes (Signed)
PT states lower back pain continued x1 week. PT denies any recent injury and states was seen at [facility] ED and was prescribed Ibuprofen and Tizandine and states no relief.

## 2015-08-18 NOTE — Discharge Instructions (Signed)

## 2015-08-18 NOTE — ED Provider Notes (Signed)
CSN: [id number]     Arrival date & time 08/18/15  1039 History  By signing my name below, I, Murriel Hopper, attest that this documentation has been prepared under the direction and in the presence of Langston Masker, [place].  Electronically Signed: Murriel Hopper, ED Scribe. 08/18/2015. 11:17 AM.  Chief Complaint  Patient presents with  . Back Pain     The history is provided by the patient. No language interpreter was used.   HPI Comments: Kevin Moreno is a 49 y.o. male who presents to the Emergency Department complaining of constant lower back pain that has been present for a week. Pt states he had a recent back injury when he was lifting boxes while he was at work and was see in [place] ED, and was prescribed Ibuprofen and Tizandine. Pt states that the Ibuprofen helps with his pain for a few hours, but then wears off and his pain returns. It is also concerning that he has lower back pain due to a previous urinary tract infection two weeks ago, in which he said his back pain felt similar. Pt states that he no longer has the same urinary symptoms.    Past Medical History  Diagnosis Date  . Dizzy spells   . Cataract    Past Surgical History  Procedure Laterality Date  . Appendectomy     Family History  Problem Relation Age of Onset  . Diabetes Mother   . Hypertension Mother   . Cancer Other    Social History  Substance Use Topics  . Smoking status: Current Every Day Smoker -- 0.50 packs/day    Types: Cigarettes  . Smokeless tobacco: Never Used  . Alcohol Use: No    Review of Systems  A complete 10 system review of systems was obtained and all systems are negative except as noted in the HPI and PMH.    Allergies  Review of patient's allergies indicates no known allergies.  Home Medications   Prior to Admission medications   Medication Sig Start Date End Date Taking? Authorizing Provider  acetaminophen (TYLENOL) 500 MG tablet Take 1,000 mg by mouth daily as needed  for mild pain or headache.     Historical Provider, MD  ciprofloxacin (CIPRO) 500 MG tablet Take 1 tablet (500 mg total) by mouth 2 (two) times daily. 07/27/15   Donnetta Hutching, MD  ibuprofen (ADVIL,MOTRIN) 200 MG tablet Take 600 mg by mouth every 6 (six) hours as needed for fever or mild pain.    Historical Provider, MD  ibuprofen (ADVIL,MOTRIN) 800 MG tablet Take 1 tablet (800 mg total) by mouth 3 (three) times daily. Patient not taking: Reported on 06/26/2015 03/11/14   Ivery Quale, PA-C  promethazine (PHENERGAN) 25 MG tablet Take 1 tablet (25 mg total) by mouth every 6 (six) hours as needed. 07/27/15   Donnetta Hutching, MD   BP 121/78 mmHg  Pulse 83  Temp(Src) 97.5 F (36.4 C) (Oral)  Resp 16  Ht  (1.676 m)  Wt 160 lb (72.576 kg)  BMI 25.84 kg/m2  SpO2 99% Physical Exam  Constitutional: He is oriented to person, place, and time. He appears well-developed and well-nourished.  HENT:  Head: Normocephalic and atraumatic.  Cardiovascular: Normal rate.   Pulmonary/Chest: Effort normal.  Abdominal: He exhibits no distension.  Musculoskeletal:  Diffusely tender lumbar time  Neurological: He is alert and oriented to person, place, and time.  Skin: Skin is warm and dry.  Psychiatric: He has a normal mood and affect.  Nursing note and vitals reviewed.   ED Course  Procedures (including critical care time)  DIAGNOSTIC STUDIES: Oxygen Saturation is 99% on room air, normal by my interpretation.    COORDINATION OF CARE: 11:25 AM Discussed treatment plan with pt at bedside and pt agreed to plan.   Labs Review Labs Reviewed  URINALYSIS, ROUTINE W REFLEX MICROSCOPIC (NOT AT [facility]) - Abnormal; Notable for the following:    Specific Gravity, Urine >1.030 (*)    Protein, ur TRACE (*)    All other components within normal limits  URINE MICROSCOPIC-ADD ON    Imaging Review No results found. I have personally reviewed and evaluated these images and lab results as part of my medical  decision-making.   EKG Interpretation None      MDM  Pt had Uti in September,   Urine clear except protein today.   Old labs reviewed.  Pt had normal bun and creatine in August.   I suspect pain is muscular.    Final diagnoses:  None    Pt given number for Orthopaedist to follow up with Prednisone taper Voltaren oow x 1 week.   I personally performed the services in this documentation, which was scribed in my presence.  The recorded information has been reviewed and considered.   Barnet Pall.   Lonia Skinner Ventura, PA-C 08/18/15 1201  Courteney Randall An, MD 08/18/15 [phone]

## 2015-09-10 ENCOUNTER — Ambulatory Visit: Payer: Self-pay | Admitting: Physician Assistant

## 2015-09-10 ENCOUNTER — Encounter: Payer: Self-pay | Admitting: Physician Assistant

## 2015-09-10 VITALS — BP 104/72 | HR 98 | Temp 97.3°F | Ht 67.5 in | Wt 167.8 lb

## 2015-09-10 DIAGNOSIS — Z131 Encounter for screening for diabetes mellitus: Secondary | ICD-10-CM

## 2015-09-10 DIAGNOSIS — F17219 Nicotine dependence, cigarettes, with unspecified nicotine-induced disorders: Secondary | ICD-10-CM | POA: Insufficient documentation

## 2015-09-10 DIAGNOSIS — Z13220 Encounter for screening for lipoid disorders: Secondary | ICD-10-CM

## 2015-09-10 DIAGNOSIS — R911 Solitary pulmonary nodule: Secondary | ICD-10-CM | POA: Insufficient documentation

## 2015-09-10 DIAGNOSIS — M545 Low back pain, unspecified: Secondary | ICD-10-CM

## 2015-09-10 LAB — GLUCOSE, POCT (MANUAL RESULT ENTRY): POC Glucose: 94 mg/dl (ref 70–99)

## 2015-09-10 MED ORDER — MELOXICAM 15 MG PO TABS: 15.0000 mg | ORAL_TABLET | Freq: Every day | ORAL | Status: DC | PRN

## 2015-09-10 NOTE — Patient Instructions (Signed)
Smoking Cessation, Tips for Success If you are ready to quit smoking, congratulations! You have chosen to help yourself be healthier. Cigarettes bring nicotine, tar, carbon monoxide, and other irritants into your body. Your lungs, heart, and blood vessels will be able to work better without these poisons. There are many different ways to quit smoking. Nicotine gum, nicotine patches, a nicotine inhaler, or nicotine nasal spray can help with physical craving. Hypnosis, support groups, and medicines help break the habit of smoking. WHAT THINGS CAN I DO TO MAKE QUITTING EASIER?  Here are some tips to help you quit for good:  Pick a date when you will quit smoking completely. Tell all of your friends and family about your plan to quit on that date.  Do not try to slowly cut down on the number of cigarettes you are smoking. Pick a quit date and quit smoking completely starting on that day.  Throw away all cigarettes.   Clean and remove all ashtrays from your home, work, and car.  On a card, write down your reasons for quitting. Carry the card with you and read it when you get the urge to smoke.  Cleanse your body of nicotine. Drink enough water and fluids to keep your urine clear or pale yellow. Do this after quitting to flush the nicotine from your body.  Learn to predict your moods. Do not let a bad situation be your excuse to have a cigarette. Some situations in your life might tempt you into wanting a cigarette.  Never have "just one" cigarette. It leads to wanting another and another. Remind yourself of your decision to quit.  Change habits associated with smoking. If you smoked while driving or when feeling stressed, try other activities to replace smoking. Stand up when drinking your coffee. Brush your teeth after eating. Sit in a different chair when you read the paper. Avoid alcohol while trying to quit, and try to drink fewer caffeinated beverages. Alcohol and caffeine may urge you to  smoke.  Avoid foods and drinks that can trigger a desire to smoke, such as sugary or spicy foods and alcohol.  Ask people who smoke not to smoke around you.  Have something planned to do right after eating or having a cup of coffee. For example, plan to take a walk or exercise.  Try a relaxation exercise to calm you down and decrease your stress. Remember, you may be tense and nervous for the first 2 weeks after you quit, but this will pass.  Find new activities to keep your hands busy. Play with a pen, coin, or rubber band. Doodle or draw things on paper.  Brush your teeth right after eating. This will help cut down on the craving for the taste of tobacco after meals. You can also try mouthwash.   Use oral substitutes in place of cigarettes. Try using lemon drops, carrots, cinnamon sticks, or chewing gum. Keep them handy so they are available when you have the urge to smoke.  When you have the urge to smoke, try deep breathing.  Designate your home as a nonsmoking area.  If you are a heavy smoker, ask your health care provider about a prescription for nicotine chewing gum. It can ease your withdrawal from nicotine.  Reward yourself. Set aside the cigarette money you save and buy yourself something nice.  Look for support from others. Join a support group or smoking cessation program. Ask someone at home or at work to help you with your plan   to quit smoking.  Always ask yourself, "Do I need this cigarette or is this just a reflex?" Tell yourself, "Today, I choose not to smoke," or "I do not want to smoke." You are reminding yourself of your decision to quit.  Do not replace cigarette smoking with electronic cigarettes (commonly called e-cigarettes). The safety of e-cigarettes is unknown, and some may contain harmful chemicals.  If you relapse, do not give up! Plan ahead and think about what you will do the next time you get the urge to smoke. HOW WILL I FEEL WHEN I QUIT SMOKING? You  may have symptoms of withdrawal because your body is used to nicotine (the addictive substance in cigarettes). You may crave cigarettes, be irritable, feel very hungry, cough often, get headaches, or have difficulty concentrating. The withdrawal symptoms are only temporary. They are strongest when you first quit but will go away within 10-14 days. When withdrawal symptoms occur, stay in control. Think about your reasons for quitting. Remind yourself that these are signs that your body is healing and getting used to being without cigarettes. Remember that withdrawal symptoms are easier to treat than the major diseases that smoking can cause.  Even after the withdrawal is over, expect periodic urges to smoke. However, these cravings are generally short lived and will go away whether you smoke or not. Do not smoke! WHAT RESOURCES ARE AVAILABLE TO HELP ME QUIT SMOKING? Your health care provider can direct you to community resources or hospitals for support, which may include:  Group support.  Education.  Hypnosis.  Therapy.   This information is not intended to replace advice given to you by your health care provider. Make sure you discuss any questions you have with your health care provider.   Document Released: 07/23/2004 Document Revised: 11/15/2014 Document Reviewed: 04/12/2013 Elsevier Interactive Patient Education 2016 [employer]  

## 2015-09-10 NOTE — Progress Notes (Signed)
BP 104/72 mmHg  Pulse 98  Temp(Src) 97.3 F (36.3 C)  Ht 5' 7.5" (1.715 m)  Wt 167 lb 12.8 oz (76.114 kg)  BMI 25.88 kg/m2  SpO2 98%   Subjective:    Patient ID: Kevin Moreno, male    DOB: 11-21-1965, 49 y.o.   MRN: [id number]  HPI: Kevin Moreno is a 49 y.o. male presenting on 09/10/2015 for New Patient (Initial Visit) and Back Pain   HPI  Pt states no problems except back- ER record for back reviewed  Relevant past medical, surgical, family and social history reviewed and updated as indicated. Interim medical history since our last visit reviewed. Allergies and medications reviewed and updated.  Review of Systems  Constitutional: Negative for fever, chills, diaphoresis, appetite change, fatigue and unexpected weight change.  HENT: Positive for sneezing. Negative for congestion, dental problem, drooling, ear pain, facial swelling, hearing loss, mouth sores, sore throat, trouble swallowing and voice change.   Eyes: Positive for pain, redness and itching. Negative for discharge and visual disturbance.  Respiratory: Positive for cough. Negative for choking, shortness of breath and wheezing.   Cardiovascular: Negative for chest pain, palpitations and leg swelling.  Gastrointestinal: Negative for vomiting, abdominal pain, diarrhea, constipation and blood in stool.  Endocrine: Negative for cold intolerance, heat intolerance and polydipsia.  Genitourinary: Negative for dysuria, hematuria and decreased urine volume.  Musculoskeletal: Positive for back pain and gait problem. Negative for arthralgias.  Skin: Positive for rash.  Allergic/Immunologic: Negative for environmental allergies.  Neurological: Positive for headaches. Negative for seizures, syncope and light-headedness.  Hematological: Negative for adenopathy.  Psychiatric/Behavioral: Positive for dysphoric mood. Negative for suicidal ideas and agitation. The patient is not nervous/anxious.     Per HPI unless  specifically indicated above     Objective:    BP 104/72 mmHg  Pulse 98  Temp(Src) 97.3 F (36.3 C)  Ht 5' 7.5" (1.715 m)  Wt 167 lb 12.8 oz (76.114 kg)  BMI 25.88 kg/m2  SpO2 98%  Wt Readings from Last 3 Encounters:  09/10/15 167 lb 12.8 oz (76.114 kg)  08/18/15 160 lb (72.576 kg)  07/27/15 160 lb (72.576 kg)    Physical Exam  Constitutional: He is oriented to person, place, and time. He appears well-developed and well-nourished.  HENT:  Head: Normocephalic and atraumatic.  Mouth/Throat: Oropharynx is clear and moist. No oropharyngeal exudate.  Eyes: Conjunctivae are normal.  L pupil not equal or reactive. R eye exam nl  Neck: Neck supple. No thyromegaly present.  Cardiovascular: Normal rate and regular rhythm.   Pulmonary/Chest: Effort normal and breath sounds normal. He has no wheezes. He has no rales.  Abdominal: Soft. Bowel sounds are normal. He exhibits no mass. There is no tenderness.  Musculoskeletal: He exhibits no edema.       Thoracic back: Normal.       Lumbar back: Normal.  Lymphadenopathy:    He has no cervical adenopathy.  Neurological: He is alert and oriented to person, place, and time. He has normal strength. He displays no tremor. He exhibits normal muscle tone. Coordination and gait normal.  Skin: Skin is warm and dry. No rash noted.  Psychiatric: He has a normal mood and affect. His behavior is normal. Thought content normal.  Vitals reviewed.   Results for orders placed or performed in visit on 09/10/15  POCT Glucose (CBG)  Result Value Ref Range   POC Glucose 94 70 - 99 mg/dl      Assessment &  Plan:    Encounter Diagnoses  Name Primary?  . Cigarette nicotine dependence with nicotine-induced disorder Yes  . Solitary pulmonary nodule   . Left-sided low back pain without sciatica   . Screening cholesterol level   . Screening for diabetes mellitus      -Discussed with pt that he needs repeat CT mid-February (pulmonary nodule on ct done  06/27/15) -Get blood drawn tomorrow morning when fasting -Check bmp- K+ was a bit low in sept -Check a1c- glu high in sept -check fasting lipids for chol screening -Counseled on smoking cessation especially in light of pulmonary nodule -rx mobic prn back pain. Discussed with pt that [facility] is not a pain clinic and cannot provide narcotics. Recommend he Tell his  Employer since he previously stated in ER record that he injured his back at work -F/u 1 mo

## 2015-09-11 LAB — LIPID PANEL
CHOL/HDL RATIO: 4.9 ratio (ref <=5.0)
Cholesterol: 221 mg/dL — ABNORMAL HIGH (ref 125–200)
HDL: 45 mg/dL (ref >=40)
LDL Cholesterol: 148 mg/dL — ABNORMAL HIGH (ref <130)
TRIGLYCERIDES: 141 mg/dL (ref <150)
VLDL: 28 mg/dL (ref <30)

## 2015-09-11 LAB — BASIC METABOLIC PANEL
BUN: 12 mg/dL (ref 7–25)
CHLORIDE: 102 mmol/L (ref 98–110)
CO2: 29 mmol/L (ref 20–31)
Calcium: 9.3 mg/dL (ref 8.6–10.3)
Creat: 1.04 mg/dL (ref 0.60–1.35)
Glucose, Bld: 90 mg/dL (ref 65–99)
POTASSIUM: 3.9 mmol/L (ref 3.5–5.3)
SODIUM: 138 mmol/L (ref 135–146)

## 2015-09-11 LAB — HEMOGLOBIN A1C
HEMOGLOBIN A1C: 6.2 % — AB (ref <5.7)
MEAN PLASMA GLUCOSE: 131 mg/dL — AB (ref <117)

## 2015-10-13 ENCOUNTER — Encounter: Payer: Self-pay | Admitting: Physician Assistant

## 2015-10-13 ENCOUNTER — Ambulatory Visit: Payer: Self-pay | Admitting: Physician Assistant

## 2015-10-13 VITALS — BP 108/74 | HR 79 | Temp 96.6°F | Ht 67.5 in | Wt 169.4 lb

## 2015-10-13 DIAGNOSIS — F17219 Nicotine dependence, cigarettes, with unspecified nicotine-induced disorders: Secondary | ICD-10-CM

## 2015-10-13 DIAGNOSIS — R911 Solitary pulmonary nodule: Secondary | ICD-10-CM

## 2015-10-13 DIAGNOSIS — E785 Hyperlipidemia, unspecified: Secondary | ICD-10-CM

## 2015-10-13 MED ORDER — MELOXICAM 15 MG PO TABS: 15.0000 mg | ORAL_TABLET | Freq: Every day | ORAL | Status: DC | PRN

## 2015-10-13 MED ORDER — SIMVASTATIN 20 MG PO TABS: 20.0000 mg | ORAL_TABLET | Freq: Every day | ORAL | Status: DC

## 2015-10-13 MED ORDER — LOVASTATIN 20 MG PO TABS: 20.0000 mg | ORAL_TABLET | Freq: Every day | ORAL | Status: DC

## 2015-10-13 NOTE — Patient Instructions (Signed)

## 2015-10-13 NOTE — Progress Notes (Signed)
BP 108/74 mmHg  Pulse 79  Temp(Src) 96.6 F (35.9 C)  Ht 5' 7.5" (1.715 m)  Wt 169 lb 6.4 oz (76.839 kg)  BMI 26.12 kg/m2  SpO2 99%   Subjective:    Patient ID: Kevin Moreno, male    DOB: 09/07/1966, 49 y.o.   MRN: [id number]  HPI: Kevin Moreno is a 49 y.o. male presenting on 10/13/2015 for Follow-up   HPI  Chief Complaint  Patient presents with  . Follow-up    pt states he is doing good    Pt states that the mobic is working well, he just ran out and needs to pick up his refill  Relevant past medical, surgical, family and social history reviewed and updated as indicated. Interim medical history since our last visit reviewed. Allergies and medications reviewed and updated.  Current outpatient prescriptions:  .  Aspirin-Caffeine (BAYER BACK & BODY) 500-32.5 MG TABS, Take 2 tablets by mouth 2 (two) times daily as needed., Disp: , Rfl:  .  ibuprofen (ADVIL,MOTRIN) 800 MG tablet, Take 1 tablet (800 mg total) by mouth 3 (three) times daily., Disp: 21 tablet, Rfl: 0 .  meloxicam (MOBIC) 15 MG tablet, Take 1 tablet (15 mg total) by mouth daily as needed for pain. (Patient not taking: Reported on 10/13/2015), Disp: 30 tablet, Rfl: 1 .  trolamine salicylate (ASPERCREME) 10 % cream, Apply 1 application topically as needed for muscle pain., Disp: , Rfl:    Review of Systems  Constitutional: Negative for fever, chills, diaphoresis, appetite change, fatigue and unexpected weight change.  HENT: Positive for congestion and sneezing. Negative for dental problem, drooling, ear pain, facial swelling, hearing loss, mouth sores, sore throat, trouble swallowing and voice change.   Eyes: Positive for pain and itching. Negative for discharge, redness and visual disturbance.  Respiratory: Positive for cough. Negative for choking, shortness of breath and wheezing.   Cardiovascular: Negative for chest pain, palpitations and leg swelling.  Gastrointestinal: Negative for vomiting,  abdominal pain, diarrhea, constipation and blood in stool.  Endocrine: Negative for cold intolerance, heat intolerance and polydipsia.  Genitourinary: Negative for dysuria, hematuria and decreased urine volume.  Musculoskeletal: Positive for back pain. Negative for arthralgias and gait problem.  Skin: Negative for rash.  Allergic/Immunologic: Negative for environmental allergies.  Neurological: Negative for seizures, syncope, light-headedness and headaches.  Hematological: Negative for adenopathy.  Psychiatric/Behavioral: Negative for suicidal ideas, dysphoric mood and agitation. The patient is not nervous/anxious.     Per HPI unless specifically indicated above     Objective:    BP 108/74 mmHg  Pulse 79  Temp(Src) 96.6 F (35.9 C)  Ht 5' 7.5" (1.715 m)  Wt 169 lb 6.4 oz (76.839 kg)  BMI 26.12 kg/m2  SpO2 99%  Wt Readings from Last 3 Encounters:  10/13/15 169 lb 6.4 oz (76.839 kg)  09/10/15 167 lb 12.8 oz (76.114 kg)  08/18/15 160 lb (72.576 kg)    Physical Exam  Constitutional: He is oriented to person, place, and time. He appears well-developed and well-nourished.  HENT:  Head: Normocephalic and atraumatic.  Neck: Neck supple.  Cardiovascular: Normal rate and regular rhythm.   Pulmonary/Chest: Effort normal and breath sounds normal. He has no wheezes.  Abdominal: Soft. Bowel sounds are normal. There is no tenderness.  Musculoskeletal: He exhibits no edema.  Lymphadenopathy:    He has no cervical adenopathy.  Neurological: He is alert and oriented to person, place, and time.  Skin: Skin is warm and dry.  Psychiatric:  He has a normal mood and affect. His behavior is normal.  Vitals reviewed.   Results for orders placed or performed in visit on 09/10/15  Lipid Profile  Result Value Ref Range   Cholesterol 221 (H) 125 - 200 mg/dL   Triglycerides 161141 <096<150 mg/dL   HDL 45 >=04>=40 mg/dL   Total CHOL/HDL Ratio 4.9 <=5.0 Ratio   VLDL 28 <30 mg/dL   LDL Cholesterol 540148 (H)  <130 mg/dL  HgB [id number]  Result Value Ref Range   Hgb A1c MFr Bld 6.2 (H) <5.7 %   Mean Plasma Glucose 131 (H) <117 mg/dL  Basic Metabolic Panel (BMET)  Result Value Ref Range   Sodium 138 135 - 146 mmol/L   Potassium 3.9 3.5 - 5.3 mmol/L   Chloride 102 98 - 110 mmol/L   CO2 29 20 - 31 mmol/L   Glucose, Bld 90 65 - 99 mg/dL   BUN 12 7 - 25 mg/dL   Creat 1.911.04 4.780.60 - 2.951.35 mg/dL   Calcium 9.3 8.6 - 62.110.3 mg/dL  POCT Glucose (CBG)  Result Value Ref Range   POC Glucose 94 70 - 99 mg/dl      Assessment & Plan:   Encounter Diagnoses  Name Primary?  . Hyperlipemia Yes  . Solitary pulmonary nodule   . Cigarette nicotine dependence with nicotine-induced disorder     Reviewed labs with pt rx simvasttatin and lowfat diet Counseled on smoking cessation F/u  47mo for chol Schedule CT chest for feb Gave cone discout application

## 2015-10-16 ENCOUNTER — Other Ambulatory Visit: Payer: Self-pay | Admitting: Physician Assistant

## 2015-10-16 DIAGNOSIS — R911 Solitary pulmonary nodule: Secondary | ICD-10-CM

## 2015-10-22 ENCOUNTER — Ambulatory Visit (HOSPITAL_COMMUNITY)
Admission: RE | Admit: 2015-10-22 | Discharge: 2015-10-22 | Disposition: A | Discharge status: Home / Self Care | Payer: Self-pay | Source: Ambulatory Visit | Attending: Physician Assistant | Admitting: Physician Assistant

## 2015-10-22 DIAGNOSIS — R590 Localized enlarged lymph nodes: Secondary | ICD-10-CM | POA: Insufficient documentation

## 2015-10-22 DIAGNOSIS — J9811 Atelectasis: Secondary | ICD-10-CM | POA: Insufficient documentation

## 2015-10-22 DIAGNOSIS — R911 Solitary pulmonary nodule: Secondary | ICD-10-CM | POA: Insufficient documentation

## 2015-11-11 ENCOUNTER — Ambulatory Visit: Payer: Self-pay | Admitting: Physician Assistant

## 2015-11-11 ENCOUNTER — Encounter: Payer: Self-pay | Admitting: Physician Assistant

## 2015-11-11 VITALS — BP 134/82 | HR 74 | Temp 97.0°F | Ht 67.5 in | Wt 171.5 lb

## 2015-11-11 DIAGNOSIS — R591 Generalized enlarged lymph nodes: Secondary | ICD-10-CM

## 2015-11-11 DIAGNOSIS — M545 Low back pain: Secondary | ICD-10-CM

## 2015-11-11 NOTE — Progress Notes (Signed)
BP 134/82 mmHg  Pulse 74  Temp(Src) 97 F (36.1 C)  Ht 5' 7.5" (1.715 m)  Wt 171 lb 8 oz (77.792 kg)  BMI 26.45 kg/m2  SpO2 98%   Subjective:    Patient ID: Kevin Moreno, male    DOB: [birth year], 50 y.o.   MRN: [id number]  HPI: Kevin Moreno is a 50 y.o. male presenting on 11/11/2015 for Adenopathy   HPI  Pt in today for exam after recent CT showed lymph node L axilla.   Recent abrasion L elbow   Relevant past medical, surgical, family and social history reviewed and updated as indicated. Interim medical history since our last visit reviewed. Allergies and medications reviewed and updated.  Current outpatient prescriptions:  .  Aspirin-Caffeine (BAYER BACK & BODY) 500-32.5 MG TABS, Take 2 tablets by mouth 2 (two) times daily as needed., Disp: , Rfl:  .  lovastatin (MEVACOR) 20 MG tablet, Take 1 tablet (20 mg total) by mouth at bedtime., Disp: 30 tablet, Rfl: 4 .  meloxicam (MOBIC) 15 MG tablet, Take 1 tablet (15 mg total) by mouth daily as needed for pain., Disp: 30 tablet, Rfl: 1 .  trolamine salicylate (ASPERCREME) 10 % cream, Apply 1 application topically as needed for muscle pain., Disp: , Rfl:    Review of Systems  Constitutional: Negative for fever, chills, diaphoresis, appetite change, fatigue and unexpected weight change.  HENT: Negative for congestion, dental problem, drooling, ear pain, facial swelling, hearing loss, mouth sores, sneezing, sore throat, trouble swallowing and voice change.   Eyes: Positive for pain, redness and itching. Negative for discharge and visual disturbance.  Respiratory: Negative for cough, choking, shortness of breath and wheezing.   Cardiovascular: Negative for chest pain, palpitations and leg swelling.  Gastrointestinal: Negative for vomiting, abdominal pain, diarrhea, constipation and blood in stool.  Endocrine: Negative for cold intolerance, heat intolerance and polydipsia.  Genitourinary: Negative for dysuria, hematuria and  decreased urine volume.  Musculoskeletal: Positive for back pain and arthralgias. Negative for gait problem.  Skin: Negative for rash.  Allergic/Immunologic: Negative for environmental allergies.  Neurological: Negative for seizures, syncope, light-headedness and headaches.  Hematological: Negative for adenopathy.  Psychiatric/Behavioral: Negative for suicidal ideas, dysphoric mood and agitation. The patient is not nervous/anxious.     Per HPI unless specifically indicated above     Objective:    BP 134/82 mmHg  Pulse 74  Temp(Src) 97 F (36.1 C)  Ht 5' 7.5" (1.715 m)  Wt 171 lb 8 oz (77.792 kg)  BMI 26.45 kg/m2  SpO2 98%  Wt Readings from Last 3 Encounters:  11/11/15 171 lb 8 oz (77.792 kg)  10/13/15 169 lb 6.4 oz (76.839 kg)  09/10/15 167 lb 12.8 oz (76.114 kg)    Physical Exam  Constitutional: He is oriented to person, place, and time. He appears well-developed and well-nourished.  HENT:  Head: Normocephalic and atraumatic.  Neck: Neck supple.  Cardiovascular: Normal rate and regular rhythm.   Pulmonary/Chest: Effort normal and breath sounds normal. He has no wheezes.  Abdominal: Soft. Bowel sounds are normal. There is no tenderness.  Lymphadenopathy:    He has no cervical adenopathy.    He has axillary adenopathy.       Left axillary: Pectoral adenopathy present.       Right: No inguinal and no supraclavicular adenopathy present.       Left: No inguinal and no supraclavicular adenopathy present.  Neurological: He is alert and oriented to person, place, and time.  Skin: Skin is warm and dry. Abrasion (healing abrasion L elbow) noted.  Psychiatric: He has a normal mood and affect. His behavior is normal.  Vitals reviewed.      Assessment & Plan:   Encounter Diagnoses  Name Primary?  . Lymphadenopathy Yes  . Low back pain, unspecified back pain laterality, with sciatica presence unspecified      -Counseled pt to ONLY take one mobic each day if needed for  pain.  If he needs something later in the day, he can take a tylenol.  Counseled on back pain and gave exercises  -Reviewed CT results with pt -F/u march as scheduled. RTO sooner prn

## 2015-11-11 NOTE — Patient Instructions (Signed)

## 2016-01-12 ENCOUNTER — Ambulatory Visit: Payer: Self-pay | Admitting: Physician Assistant

## 2016-01-14 ENCOUNTER — Encounter: Payer: Self-pay | Admitting: Physician Assistant

## 2016-05-14 IMAGING — CT CT ABD-PELV W/ CM
2 of 5 series · 15 of 46 positions shown, 17 images · IV contrast (Omnipaque 300)
Comparison: Prior radiograph from 09/26/2008

CLINICAL DATA: Initial evaluation for acute right lower quadrant
abdominal pain.

EXAM:
CT ABDOMEN AND PELVIS WITH CONTRAST
TECHNIQUE: Multidetector CT imaging of the abdomen and pelvis was performed
using the standard protocol following bolus administration of
intravenous contrast.
CONTRAST:  50mL OMNIPAQUE IOHEXOL 300 MG/ML SOLN, 100mL OMNIPAQUE
IOHEXOL 300 MG/ML SOLN

[Series 2: abd_pel_with 5.0 b40f · axial · 0.73mm/px · z∈[-432,-12]mm · 12 of 96 slices shown, 14 images]
[im 6/96  soft-tissue]
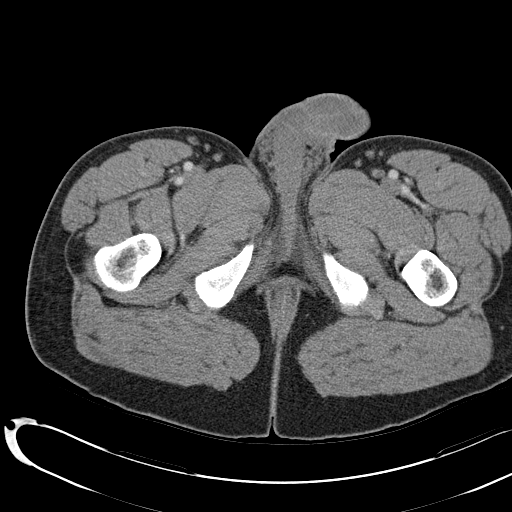
[im 6/96  bone]
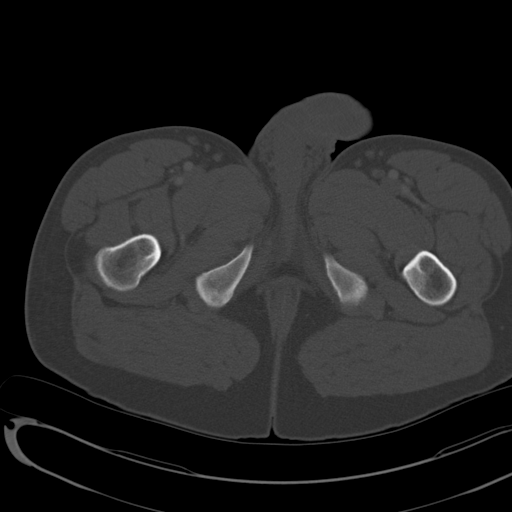
[im 17/96  soft-tissue]
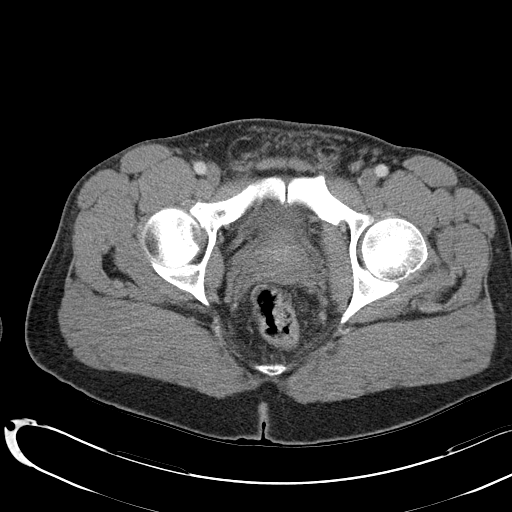
[im 23/96  soft-tissue]
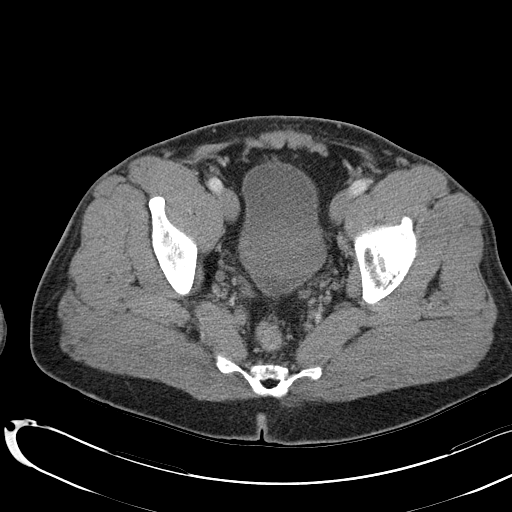
[im 28/96  soft-tissue]
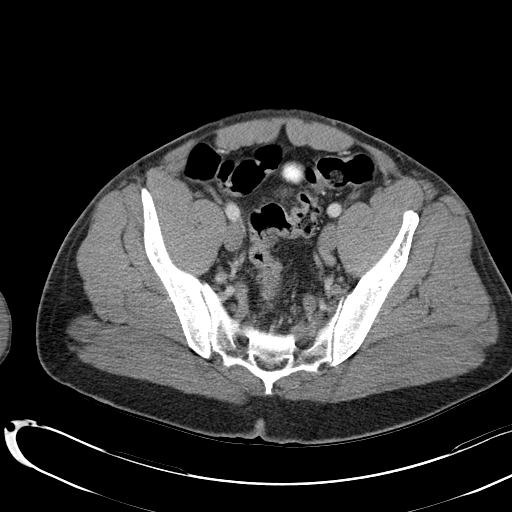
[im 40/96  soft-tissue]
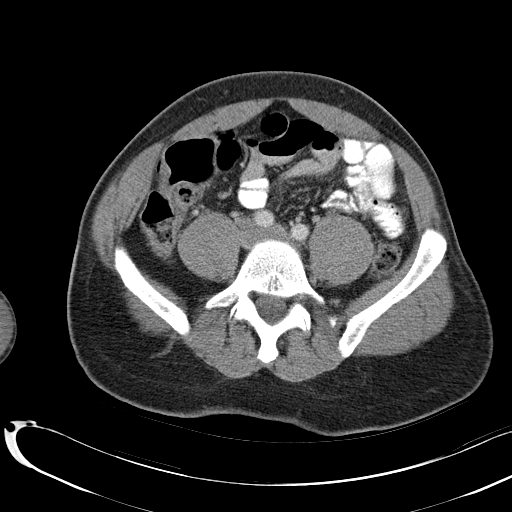
[im 45/96  soft-tissue]
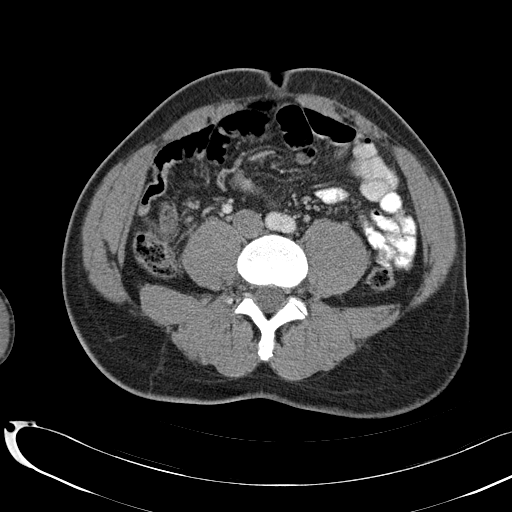
[im 51/96  soft-tissue]
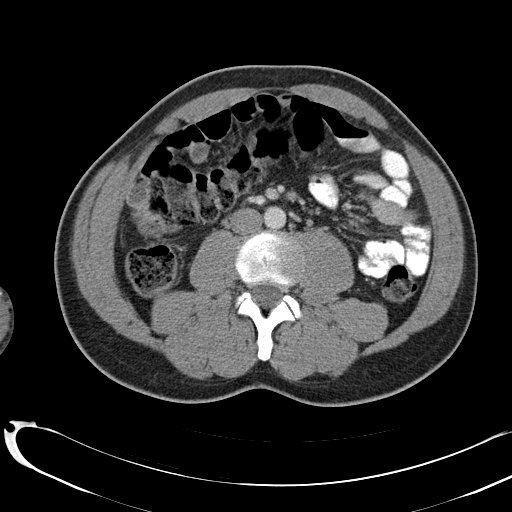
[im 62/96  soft-tissue]
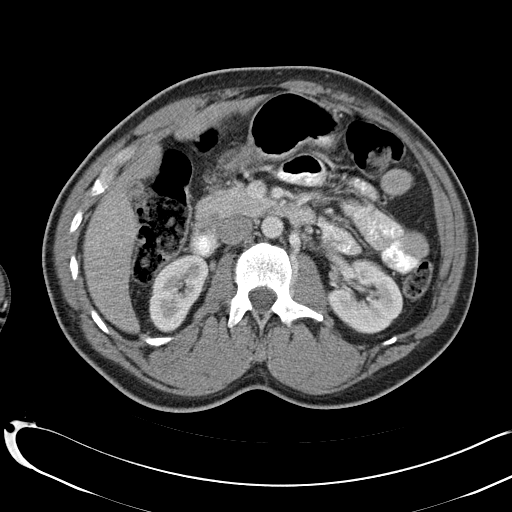
[im 68/96  soft-tissue]
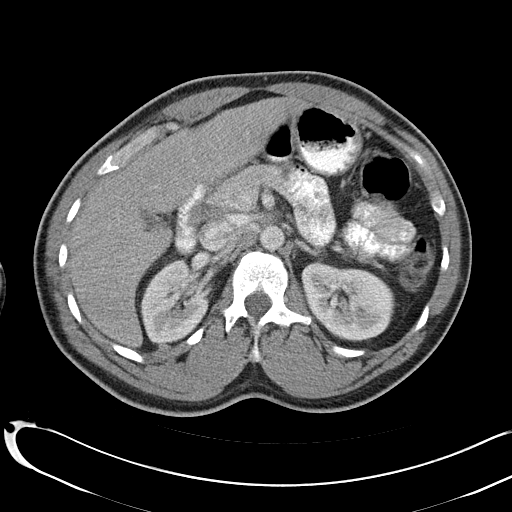
[im 68/96  bone]
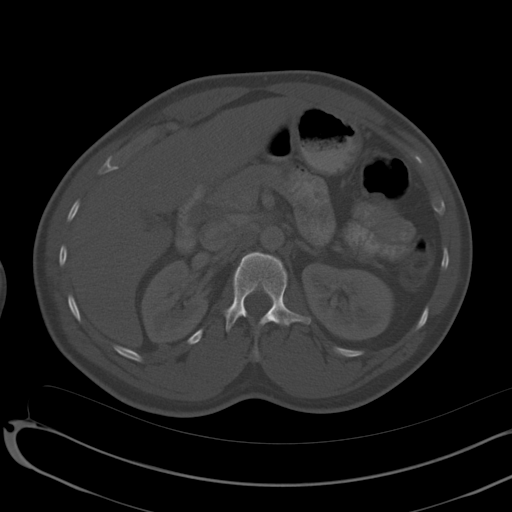
[im 73/96  soft-tissue]
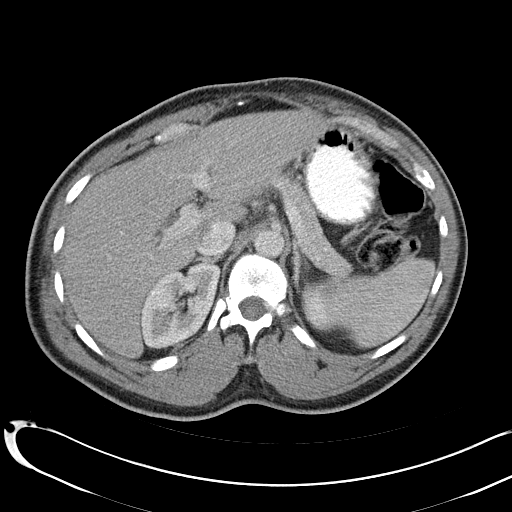
[im 84/96  soft-tissue]
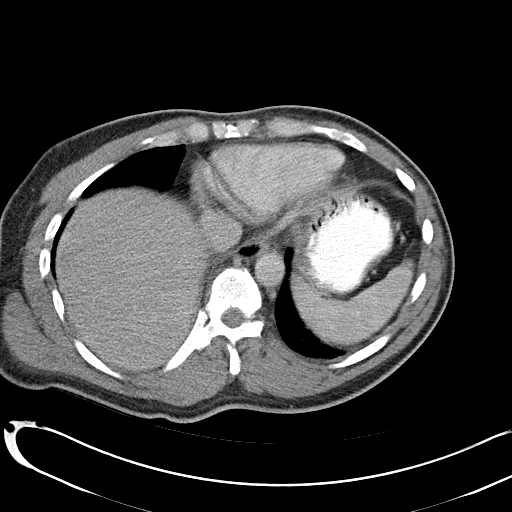
[im 90/96  soft-tissue]
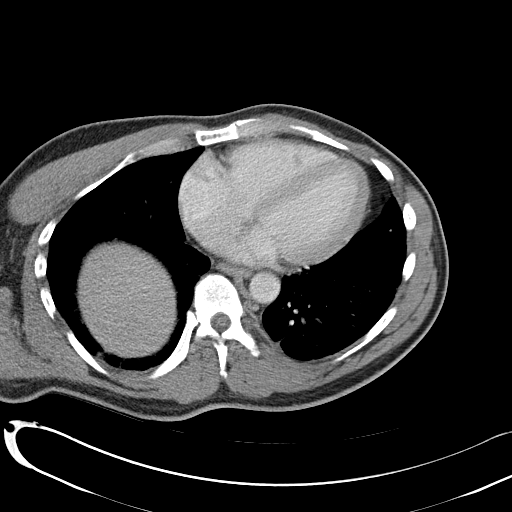

[Series 3: abd_pel_with 3.0 spo cor · coronal · 0.63mm/px · 3 of 81 slices shown]
[im 27/81  soft-tissue]
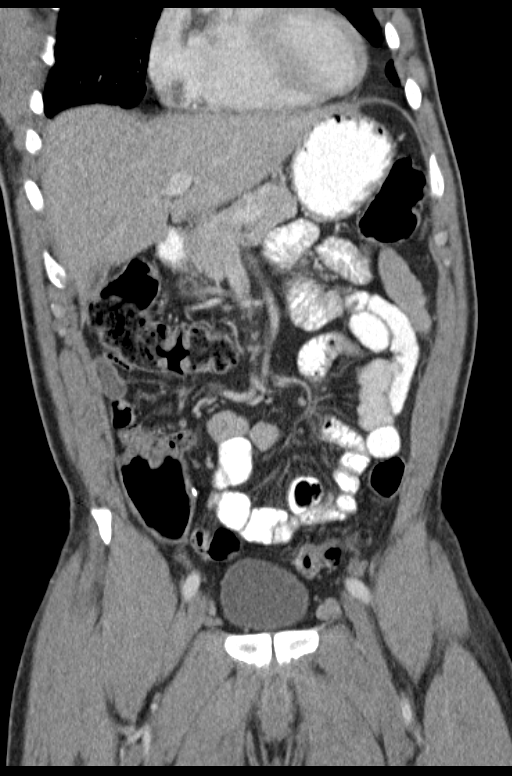
[im 36/81  soft-tissue]
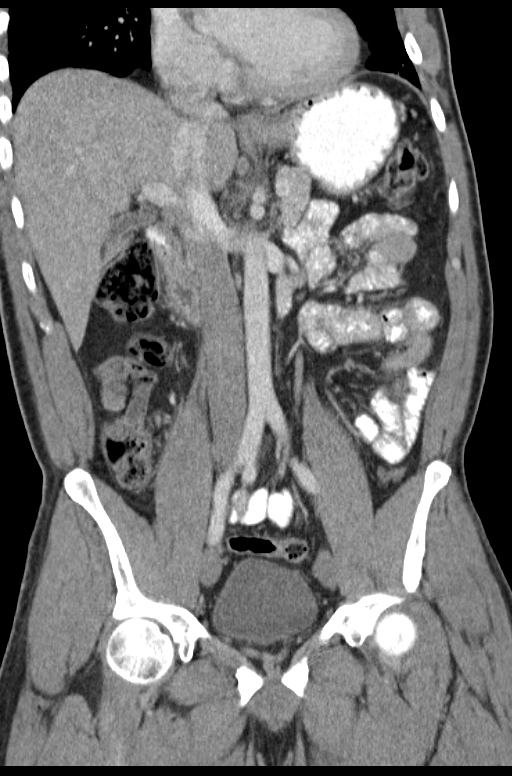
[im 45/81  soft-tissue]
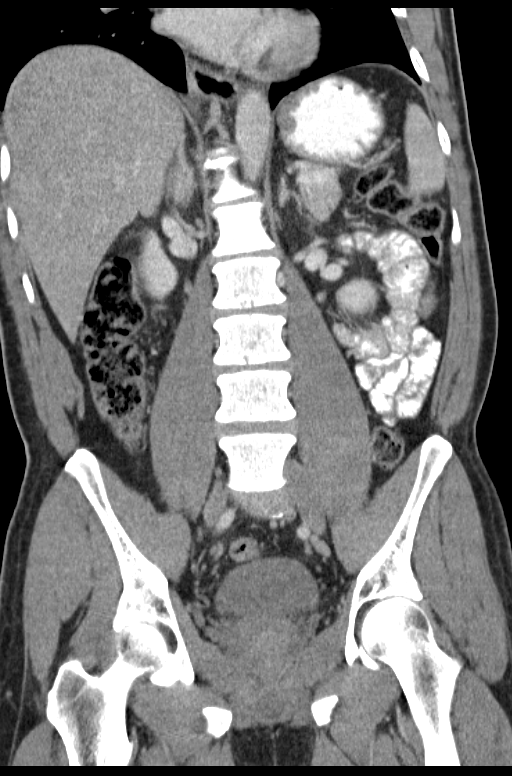

[15 of 46 positions shown; findings below may reference images not displayed]

FINDINGS: Mild atelectatic changes seen dependently within the bilateral lung
bases, right greater than left. 5 mm nodule along the left
hemidiaphragm (series 2, image 11). Indeterminate. Visualized lungs
are otherwise unremarkable.

Liver demonstrates a normal contrast enhanced appearance.
Gallbladder is largely decompressed but grossly unremarkable. No
biliary dilatation. Spleen, adrenal glands, and pancreas demonstrate
a normal contrast enhanced appearance.

Kidneys are equal size with symmetric enhancement. No
nephrolithiasis, hydronephrosis, or focal enhancing renal mass.

Stomach within normal limits. No evidence for bowel obstruction.
Appendix is absent. Terminal ileum within normal limits. No abnormal
wall thickening, mucosal enhancement, or inflammatory fat stranding
seen about the bowels.

Bladder within normal limits. No acute abnormality about the
prostate.

No free air or fluid. Single enlarged 14 mm left external iliac
lymph node (series 3, image 28), indeterminate. No other adenopathy
within the abdomen and pelvis. Normal intravascular enhancement seen
throughout the intra-abdominal aorta and its branch vessels.

No acute osseous abnormality. No worrisome lytic or blastic osseous
lesions.
IMPRESSION: 1. No CT evidence for acute intra-abdominal or pelvic process.
2. Status post appendectomy.
3. Single enlarged 14 mm left external iliac lymph node,
indeterminate. No other adenopathy within the abdomen and pelvis.
4. 5 mm pulmonary nodule along the left hemidiaphragm,
indeterminate. If the patient is at high risk for bronchogenic
carcinoma, follow-up chest CT at 6-12 months is recommended. If the
patient is at low risk for bronchogenic carcinoma, follow-up chest
CT at 12 months is recommended. This recommendation follows the
consensus statement: Guidelines for Management of Small Pulmonary
Nodules Detected on CT Scans: A Statement from the [facility] [facility]

## 2016-11-15 ENCOUNTER — Emergency Department (HOSPITAL_COMMUNITY)
Admission: EM | Admit: 2016-11-15 | Discharge: 2016-11-15 | Disposition: A | Discharge status: Home / Self Care | Payer: Self-pay | Attending: Emergency Medicine | Admitting: Emergency Medicine

## 2016-11-15 ENCOUNTER — Emergency Department (HOSPITAL_COMMUNITY): Payer: Self-pay

## 2016-11-15 DIAGNOSIS — Y92000 Kitchen of unspecified non-institutional (private) residence as  the place of occurrence of the external cause: Secondary | ICD-10-CM | POA: Insufficient documentation

## 2016-11-15 DIAGNOSIS — Y93G9 Activity, other involving cooking and grilling: Secondary | ICD-10-CM | POA: Insufficient documentation

## 2016-11-15 DIAGNOSIS — T300 Burn of unspecified body region, unspecified degree: Secondary | ICD-10-CM

## 2016-11-15 DIAGNOSIS — T24231A Burn of second degree of right lower leg, initial encounter: Secondary | ICD-10-CM | POA: Insufficient documentation

## 2016-11-15 DIAGNOSIS — T25221A Burn of second degree of right foot, initial encounter: Secondary | ICD-10-CM | POA: Insufficient documentation

## 2016-11-15 DIAGNOSIS — T24232A Burn of second degree of left lower leg, initial encounter: Secondary | ICD-10-CM | POA: Insufficient documentation

## 2016-11-15 DIAGNOSIS — X102XXA Contact with fats and cooking oils, initial encounter: Secondary | ICD-10-CM | POA: Insufficient documentation

## 2016-11-15 DIAGNOSIS — T25222A Burn of second degree of left foot, initial encounter: Secondary | ICD-10-CM | POA: Insufficient documentation

## 2016-11-15 DIAGNOSIS — T22212A Burn of second degree of left forearm, initial encounter: Secondary | ICD-10-CM | POA: Insufficient documentation

## 2016-11-15 DIAGNOSIS — R791 Abnormal coagulation profile: Secondary | ICD-10-CM | POA: Insufficient documentation

## 2016-11-15 DIAGNOSIS — Y999 Unspecified external cause status: Secondary | ICD-10-CM | POA: Insufficient documentation

## 2016-11-15 LAB — COMPREHENSIVE METABOLIC PANEL
ALT: 32 U/L (ref 17–63)
AST: 24 U/L (ref 15–41)
Albumin: 3.8 g/dL (ref 3.5–5.0)
Alkaline Phosphatase: 69 U/L (ref 38–126)
Anion gap: 9 (ref 5–15)
BUN: 7 mg/dL (ref 6–20)
CHLORIDE: 106 mmol/L (ref 101–111)
CO2: 25 mmol/L (ref 22–32)
CREATININE: 1.24 mg/dL (ref 0.61–1.24)
Calcium: 8.7 mg/dL — ABNORMAL LOW (ref 8.9–10.3)
GFR calc Af Amer: >60 mL/min (ref >60)
Glucose, Bld: 145 mg/dL — ABNORMAL HIGH (ref 65–99)
Potassium: 3.5 mmol/L (ref 3.5–5.1)
Sodium: 140 mmol/L (ref 135–145)
TOTAL PROTEIN: 7.1 g/dL (ref 6.5–8.1)
Total Bilirubin: 0.5 mg/dL (ref 0.3–1.2)

## 2016-11-15 LAB — CBC
HCT: 44.3 % (ref 39.0–52.0)
Hemoglobin: 14.8 g/dL (ref 13.0–17.0)
MCH: 30 pg (ref 26.0–34.0)
MCHC: 33.4 g/dL (ref 30.0–36.0)
MCV: 89.7 fL (ref 78.0–100.0)
PLATELETS: 193 10*3/uL (ref 150–400)
RBC: 4.94 MIL/uL (ref 4.22–5.81)
RDW: 12.7 % (ref 11.5–15.5)
WBC: 6.4 10*3/uL (ref 4.0–10.5)

## 2016-11-15 LAB — PREPARE FRESH FROZEN PLASMA
Unit division: 0
Unit division: 0

## 2016-11-15 LAB — TYPE AND SCREEN
ABO/RH(D): O POS
ANTIBODY SCREEN: NEGATIVE
Unit division: 0
Unit division: 0

## 2016-11-15 LAB — I-STAT CHEM 8, ED
BUN: 10 mg/dL (ref 6–20)
CREATININE: 1.1 mg/dL (ref 0.61–1.24)
Calcium, Ion: 1.13 mmol/L — ABNORMAL LOW (ref 1.15–1.40)
Chloride: 102 mmol/L (ref 101–111)
GLUCOSE: 142 mg/dL — AB (ref 65–99)
HCT: 45 % (ref 39.0–52.0)
HEMOGLOBIN: 15.3 g/dL (ref 13.0–17.0)
POTASSIUM: 3.8 mmol/L (ref 3.5–5.1)
Sodium: 140 mmol/L (ref 135–145)
TCO2: 27 mmol/L (ref 0–100)

## 2016-11-15 LAB — COOXEMETRY PANEL
CARBOXYHEMOGLOBIN: 3 % — AB (ref 0.5–1.5)
METHEMOGLOBIN: 0.8 % (ref 0.0–1.5)
O2 Saturation: 73.7 %
Total hemoglobin: 14.6 g/dL (ref 12.0–16.0)

## 2016-11-15 LAB — ABO/RH: ABO/RH(D): O POS

## 2016-11-15 LAB — ETHANOL

## 2016-11-15 LAB — I-STAT CG4 LACTIC ACID, ED: LACTIC ACID, VENOUS: 3.83 mmol/L — AB (ref 0.5–1.9)

## 2016-11-15 LAB — PROTIME-INR
INR: 0.9
PROTHROMBIN TIME: 12.1 s (ref 11.4–15.2)

## 2016-11-15 LAB — CDS SEROLOGY

## 2016-11-15 MED ORDER — HYDROMORPHONE HCL 2 MG/ML IJ SOLN
INTRAMUSCULAR | Status: AC
  Administered 2016-11-15: 1 mg via INTRAVENOUS
  Filled 2016-11-15: qty 1

## 2016-11-15 MED ORDER — HYDROMORPHONE HCL 2 MG/ML IJ SOLN
INTRAMUSCULAR | Status: AC
  Filled 2016-11-15: qty 1

## 2016-11-15 MED ORDER — FENTANYL CITRATE (PF) 100 MCG/2ML IJ SOLN
50.0000 ug | Freq: Once | INTRAMUSCULAR | Status: AC
  Administered 2016-11-15: 50 ug via INTRAVENOUS

## 2016-11-15 MED ORDER — FENTANYL CITRATE (PF) 100 MCG/2ML IJ SOLN
INTRAMUSCULAR | Status: AC | PRN
  Administered 2016-11-15 (×2): 50 ug via INTRAVENOUS

## 2016-11-15 MED ORDER — HYDROMORPHONE HCL 2 MG/ML IJ SOLN
1.0000 mg | INTRAMUSCULAR | Status: AC | PRN
  Administered 2016-11-15 (×3): 1 mg via INTRAVENOUS

## 2016-11-15 MED ORDER — LACTATED RINGERS IV BOLUS (SEPSIS)
1000.0000 mL | Freq: Once | INTRAVENOUS | Status: AC
  Administered 2016-11-15: 1000 mL via INTRAVENOUS

## 2016-11-15 MED ORDER — FENTANYL CITRATE (PF) 100 MCG/2ML IJ SOLN
INTRAMUSCULAR | Status: AC
  Filled 2016-11-15: qty 2

## 2016-11-15 MED ORDER — LACTATED RINGERS IV SOLN
INTRAVENOUS | Status: DC
  Administered 2016-11-15: 18:00:00 via INTRAVENOUS
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
## 2016-11-15 NOTE — ED Notes (Signed)
Dr. Knapp notified of elevated CG-4 

## 2016-11-15 NOTE — ED Notes (Signed)
Bilateral feet, lower legs bandaged with sterile saline and dressings. Rt upper arm bandaged with sterile saline and gauze.
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
## 2016-11-15 NOTE — ED Notes (Signed)
Trauma audit form handed off to [employer] Devices[employer]ricket RN
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
## 2018-07-20 ENCOUNTER — Encounter (HOSPITAL_COMMUNITY): Payer: Self-pay | Admitting: Emergency Medicine

## 2018-07-20 ENCOUNTER — Other Ambulatory Visit: Payer: Self-pay

## 2018-07-20 ENCOUNTER — Emergency Department (HOSPITAL_COMMUNITY)
Admission: EM | Admit: 2018-07-20 | Discharge: 2018-07-20 | Disposition: A | Discharge status: Home / Self Care | Payer: Self-pay | Attending: Emergency Medicine | Admitting: Emergency Medicine

## 2018-07-20 DIAGNOSIS — F17210 Nicotine dependence, cigarettes, uncomplicated: Secondary | ICD-10-CM | POA: Insufficient documentation

## 2018-07-20 DIAGNOSIS — H6121 Impacted cerumen, right ear: Secondary | ICD-10-CM | POA: Insufficient documentation

## 2018-07-20 DIAGNOSIS — Z79899 Other long term (current) drug therapy: Secondary | ICD-10-CM | POA: Insufficient documentation

## 2018-07-20 MED ORDER — HYDROGEN PEROXIDE 3 % EX SOLN
CUTANEOUS | Status: AC
  Filled 2018-07-20: qty 473

## 2018-07-20 NOTE — ED Triage Notes (Signed)
PT c/o fullness and decreased hearing to right ear. PT denies any pain or fever.

## 2018-07-21 NOTE — ED Provider Notes (Signed)
[facility] EMERGENCY DEPARTMENT Provider Note   CSN: [id number] Arrival date & time: 07/20/18  1046     History   Chief Complaint Chief Complaint  Patient presents with  . Ear Fullness    HPI Kevin Moreno is a 52 y.o. male.  The history is provided by the patient. No language interpreter was used.  Ear Fullness  This is a new problem. The current episode started more than 1 week ago. The problem occurs constantly. The problem has not changed since onset.Pertinent negatives include no shortness of breath. Nothing aggravates the symptoms. Nothing relieves the symptoms. He has tried nothing for the symptoms. The treatment provided no relief.  Pt reports fullness and decreased hearing out of right ear.  Past Medical History:  Diagnosis Date  . Back pain   . Blind left eye   . Cataract   . Dizzy spells     Patient Active Problem List   Diagnosis Date Noted  . Hyperlipemia 10/13/2015  . Cigarette nicotine dependence with nicotine-induced disorder 09/10/2015  . Solitary pulmonary nodule 09/10/2015    Past Surgical History:  Procedure Laterality Date  . APPENDECTOMY          Home Medications    Prior to Admission medications   Medication Sig Start Date End Date Taking? Authorizing Provider  acetaminophen (TYLENOL) 500 MG tablet Take 500-1,000 mg by mouth every 6 (six) hours as needed for headache (or pain).    [provider]  aspirin EC 325 MG tablet Take 325 mg by mouth daily as needed (for pain or headaches).    [provider]  Aspirin-Caffeine (BAYER BACK & BODY) 500-32.5 MG TABS Take 2 tablets by mouth 2 (two) times daily as needed.    [provider]  lovastatin (MEVACOR) 20 MG tablet Take 1 tablet (20 mg total) by mouth at bedtime. 10/13/15   Jacquelin Hawking, PA-C  meloxicam (MOBIC) 15 MG tablet Take 1 tablet (15 mg total) by mouth daily as needed for pain. 10/13/15   Jacquelin Hawking, PA-C  trolamine salicylate (ASPERCREME) 10  % cream Apply 1 application topically as needed for muscle pain.    [provider]  UNKNOWN TO PATIENT Cough/cold formula (contains Benadryl): Take 1-2 teaspoonsful by mouth every 6 hours as needed for cough/cold symptoms    [provider]    Family History Family History  Problem Relation Age of Onset  . Diabetes Mother   . Hypertension Mother   . Diabetes Maternal Aunt   . Hypertension Maternal Aunt   . Cancer Maternal Aunt   . Diabetes Maternal Grandmother   . Hypertension Maternal Grandmother   . Cancer Other     Social History Social History   Tobacco Use  . Smoking status: Current Every Day Smoker    Packs/day: 0.50    Years: 21.00    Pack years: 10.50    Types: Cigarettes  . Smokeless tobacco: Never Used  Substance Use Topics  . Alcohol use: No  . Drug use: No     Allergies   Pollen extract   Review of Systems Review of Systems  Constitutional: Negative for chills and fever.  HENT: Positive for ear pain.   Respiratory: Negative for cough and shortness of breath.   Skin: Negative for color change and rash.  All other systems reviewed and are negative.    Physical Exam Updated Vital Signs BP 127/76 (BP Location: Right Arm)   Pulse 70   Temp 97.9 F (36.6  C) (Oral)   Resp 18   Ht 5\' 6"  (1.676 m)   Wt 77.1 kg   SpO2 100%   BMI 27.44 kg/m   Physical Exam  Constitutional: He appears well-developed and well-nourished.  HENT:  Head: Normocephalic and atraumatic.  Left Ear: External ear normal.  Left ear canal  Cerumen impaction  Eyes: Conjunctivae are normal.  Neck: Neck supple.  Cardiovascular: Normal rate and regular rhythm.  No murmur heard. Pulmonary/Chest: Effort normal and breath sounds normal. No respiratory distress.  Abdominal: Soft. There is no tenderness.  Musculoskeletal: He exhibits no edema.  Neurological: He is alert.  Skin: Skin is warm and dry.  Psychiatric: He has a normal mood and affect.  Nursing note  and vitals reviewed.    ED Treatments / Results  Labs (all labs ordered are listed, but only abnormal results are displayed) Labs Reviewed - No data to display  EKG None  Radiology No results found.  Procedures Procedures (including critical care time)  Medications Ordered in ED Medications - No data to display   Initial Impression / Assessment and Plan / ED Course  I have reviewed the triage vital signs and the nursing notes.  Pertinent labs & imaging results that were available during my care of the patient were reviewed by me and considered in my medical decision making (see chart for details).     RN irrigatted canal.  Large amount of wax removed.  Pt reports relief of symptoms  Final Clinical Impressions(s) / ED Diagnoses   Final diagnoses:  Hearing loss due to cerumen impaction, right    ED Discharge Orders    None    An After Visit Summary was printed and given to the patient.    Osie CheeksSofia, Fawaz Borquez K, PA-C 07/21/18 1425    Raeford RazorKohut, Stephen, MD 07/22/18 1136

## 2018-10-03 ENCOUNTER — Encounter: Payer: Self-pay | Admitting: Physician Assistant

## 2018-10-03 ENCOUNTER — Ambulatory Visit: Payer: Self-pay | Admitting: Physician Assistant

## 2018-10-03 VITALS — BP 123/75 | HR 76 | Temp 97.0°F | Ht 67.75 in | Wt 180.5 lb

## 2018-10-03 DIAGNOSIS — E785 Hyperlipidemia, unspecified: Secondary | ICD-10-CM

## 2018-10-03 DIAGNOSIS — Z125 Encounter for screening for malignant neoplasm of prostate: Secondary | ICD-10-CM

## 2018-10-03 DIAGNOSIS — R7303 Prediabetes: Secondary | ICD-10-CM

## 2018-10-03 DIAGNOSIS — H538 Other visual disturbances: Secondary | ICD-10-CM

## 2018-10-03 DIAGNOSIS — F329 Major depressive disorder, single episode, unspecified: Secondary | ICD-10-CM

## 2018-10-03 DIAGNOSIS — H5450 Low vision, one eye, unspecified eye: Secondary | ICD-10-CM

## 2018-10-03 DIAGNOSIS — H5440 Blindness, one eye, unspecified eye: Secondary | ICD-10-CM

## 2018-10-03 DIAGNOSIS — H44522 Atrophy of globe, left eye: Secondary | ICD-10-CM

## 2018-10-03 DIAGNOSIS — Z7689 Persons encountering health services in other specified circumstances: Secondary | ICD-10-CM

## 2018-10-03 DIAGNOSIS — H5711 Ocular pain, right eye: Secondary | ICD-10-CM

## 2018-10-03 DIAGNOSIS — H33052 Total retinal detachment, left eye: Secondary | ICD-10-CM

## 2018-10-03 MED ORDER — CITALOPRAM HYDROBROMIDE 20 MG PO TABS: 20.0000 mg | ORAL_TABLET | Freq: Every day | ORAL | 0 refills | Status: DC

## 2018-10-03 NOTE — Progress Notes (Signed)
BP 123/75 (BP Location: Right Arm, Patient Position: Sitting, Cuff Size: Normal)   Pulse 76   Temp (!) 97 F (36.1 C)   Ht 5' 7.75" (1.721 m)   Wt 180 lb 8 oz (81.9 kg)   SpO2 97%   BMI 27.65 kg/m    Subjective:    Patient ID: Kevin Moreno, male    DOB: April 30, 1966, 52 y.o.   MRN: [id number]  HPI: Kevin Moreno is a 52 y.o. male presenting on 10/03/2018 for New Patient (Initial Visit) (returning pt. pt states having depression due to L eye blindness. pt states it started off as cataract but was told it was something else. pt states he feels soreness on L eye. pt states he feels burning on both eyes. pt states he is applying for disaility and states he was told to find provider)   HPI   Chief Complaint  Patient presents with  . New Patient (Initial Visit)    returning pt. pt states having depression due to L eye blindness. pt states it started off as cataract but was told it was something else. pt states he feels soreness on L eye. pt states he feels burning on both eyes. pt states he is applying for disaility and states he was told to find provider    Pt had been to eye specialist in [place] but he stopped going due to he couldn't afford it.  He says he hasn't been there in about 4 years.   Pt was going for L eye blind.   Pt says R eye began having problems not long after his last eye doctor visit. He says the R eye has gradually been getting worse.   Pt last seen here at [facility] on 11/11/2015.    Pt says no problems except his eyes.    Pt was working 2 jobs in the past but had to quit due to he couldn't see.    Pt also relates depression over his situation with his eyes.  He denies SI, HI.    Relevant past medical, surgical, family and social history reviewed and updated as indicated. Interim medical history since our last visit reviewed. Allergies and medications reviewed and updated.   Current Outpatient Medications:  .  ibuprofen (ADVIL) 200 MG tablet,  Take 200 mg by mouth every 6 (six) hours as needed., Disp: , Rfl:     Review of Systems  Eyes: Positive for pain and visual disturbance.  Respiratory: Negative for shortness of breath.   Cardiovascular: Negative for chest pain.  Gastrointestinal: Negative for abdominal pain.  Psychiatric/Behavioral: Positive for dysphoric mood. Negative for suicidal ideas.    Per HPI unless specifically indicated above     Objective:    BP 123/75 (BP Location: Right Arm, Patient Position: Sitting, Cuff Size: Normal)   Pulse 76   Temp (!) 97 F (36.1 C)   Ht 5' 7.75" (1.721 m)   Wt 180 lb 8 oz (81.9 kg)   SpO2 97%   BMI 27.65 kg/m   Wt Readings from Last 3 Encounters:  10/03/18 180 lb 8 oz (81.9 kg)  07/20/18 170 lb (77.1 kg)  11/15/16 170 lb (77.1 kg)    Physical Exam  Constitutional: He is oriented to person, place, and time. He appears well-developed and well-nourished.  HENT:  Head: Normocephalic and atraumatic.  Mouth/Throat: Oropharynx is clear and moist. No oropharyngeal exudate.  Eyes:  Pt unable to open eyes for exam on his own.  Examiner  opened eyes.   OS abnormal thing seen through pupil- white matter.  OD pupil black.  Pt with difficulty keeping eyes still- involuntary eye movements impeded exam.   Pt unable to see enough to read but sees light and shapes enough to walk through building without tripping over obstacles.   Neck: Neck supple. No thyromegaly present.  Cardiovascular: Normal rate and regular rhythm.  Pulmonary/Chest: Effort normal and breath sounds normal. He has no wheezes. He has no rales.  Abdominal: Soft. Bowel sounds are normal. He exhibits no mass. There is no hepatosplenomegaly. There is no tenderness.  Musculoskeletal: He exhibits no edema.  Lymphadenopathy:    He has no cervical adenopathy.  Neurological: He is alert and oriented to person, place, and time.  Skin: Skin is warm and dry. No rash noted.  Psychiatric: He has a normal mood and affect. His  behavior is normal. Thought content normal.  Vitals reviewed.       Assessment & Plan:   Encounter Diagnoses  Name Primary?  . Encounter to establish care Yes  . Ocular pain, right eye   . Blurred vision, right eye   . Hyperlipidemia, unspecified hyperlipidemia type   . Prediabetes   . Reactive depression (situational)   . 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 Total retinal detachment of left eye   . Phthisis bulbi of left eye   . 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   - OD- vitreous clear, optic nerve flat, sharp, good coloc, macula sharp foveal reflex otherwise flat and attached, vesses normal. Acuity 20/25  -Pt now complaining of vision issues in the R eye - [facility].  Nurse called.  Scheduling difficult due to transportation issues.  Nurse was told to have [facility] and on call ophthalmologist could evaluate.  Pt says he doesn't think he can get there today.  Pt urged to go ASAP in order to try to preserve vision in the Right eye.  (nurse was told that ER can arrange for pt's follow up with eye doctor at that facility)  -Pt to follow up here 3 weeks to check mood.  RTO sooner prn

## 2018-10-11 ENCOUNTER — Encounter: Payer: Self-pay | Admitting: Student

## 2018-10-17 ENCOUNTER — Encounter: Payer: Self-pay | Admitting: Physician Assistant

## 2018-10-17 ENCOUNTER — Ambulatory Visit: Payer: Self-pay | Admitting: Physician Assistant

## 2018-10-17 VITALS — BP 114/77 | HR 86 | Temp 96.8°F | Ht 67.75 in | Wt 182.5 lb

## 2018-10-17 DIAGNOSIS — Z9119 Patient's noncompliance with other medical treatment and regimen: Secondary | ICD-10-CM

## 2018-10-17 DIAGNOSIS — Z91199 Patient's noncompliance with other medical treatment and regimen due to unspecified reason: Secondary | ICD-10-CM

## 2018-10-17 DIAGNOSIS — E785 Hyperlipidemia, unspecified: Secondary | ICD-10-CM

## 2018-10-17 DIAGNOSIS — F329 Major depressive disorder, single episode, unspecified: Secondary | ICD-10-CM

## 2018-10-17 MED ORDER — BENZONATATE 100 MG PO CAPS: ORAL_CAPSULE | ORAL | 3 refills | Status: DC

## 2018-10-17 NOTE — Progress Notes (Signed)
BP 114/77 (BP Location: Right Arm, Patient Position: Sitting, Cuff Size: Normal)   Pulse 86   Temp (!) 96.8 F (36 C)   Ht 5' 7.75" (1.721 m)   Wt 182 lb 8 oz (82.8 kg)   SpO2 96%   BMI 27.95 kg/m    Subjective:    Patient ID: Kevin Moreno, male    DOB: [birth year], 52 y.o.   MRN: [id number]  HPI: Kevin Moreno is a 52 y.o. male presenting on 10/17/2018 for Follow-up   HPI   Pt didn't get depression meds- he says he forgot.  He also didn't get labs drawn.    He [facility]-  He has f/u feb 6 or 7  With eye doctor.  He says he has noticed improvement since following the instructions given to him   Relevant past medical, surgical, family and social history reviewed and updated as indicated. Interim medical history since our last visit reviewed. Allergies and medications reviewed and updated.   Current Outpatient Medications:  .  hypromellose (GENTEAL) 0.3 % GEL ophthalmic ointment, Place 1 application into both eyes. Four times a day, Disp: , Rfl:  .  ibuprofen (ADVIL) 200 MG tablet, Take 200 mg by mouth every 6 (six) hours as needed., Disp: , Rfl:  .  citalopram (CELEXA) 20 MG tablet, Take 1 tablet (20 mg total) by mouth daily. (Patient not taking: Reported on 10/17/2018), Disp: 30 tablet, Rfl: 0  Review of Systems  Constitutional: Negative for appetite change, chills, diaphoresis, fatigue, fever and unexpected weight change.  HENT: Positive for sneezing. Negative for congestion, dental problem, drooling, ear pain, facial swelling, hearing loss, mouth sores, sore throat, trouble swallowing and voice change.   Eyes: Positive for pain, redness and itching. Negative for discharge and visual disturbance.  Respiratory: Negative for cough, choking, shortness of breath and wheezing.   Cardiovascular: Negative for chest pain, palpitations and leg swelling.  Gastrointestinal: Negative for abdominal pain, blood in stool, constipation, diarrhea and  vomiting.  Endocrine: Negative for cold intolerance, heat intolerance and polydipsia.  Genitourinary: Negative for decreased urine volume, dysuria and hematuria.  Musculoskeletal: Positive for arthralgias. Negative for back pain and gait problem.  Skin: Negative for rash.  Allergic/Immunologic: Negative for environmental allergies.  Neurological: Negative for seizures, syncope, light-headedness and headaches.  Hematological: Negative for adenopathy.  Psychiatric/Behavioral: Negative for agitation, dysphoric mood and suicidal ideas. The patient is not nervous/anxious.     Per HPI unless specifically indicated above      Objective:    BP 114/77 (BP Location: Right Arm, Patient Position: Sitting, Cuff Size: Normal)   Pulse 86   Temp (!) 96.8 F (36 C)   Ht 5' 7.75" (1.721 m)   Wt 182 lb 8 oz (82.8 kg)   SpO2 96%   BMI 27.95 kg/m   Wt Readings from Last 3 Encounters:  10/17/18 182 lb 8 oz (82.8 kg)  10/03/18 180 lb 8 oz (81.9 kg)  07/20/18 170 lb (77.1 kg)    Physical Exam  Constitutional: He is oriented to person, place, and time. He appears well-developed and well-nourished.  HENT:  Head: Normocephalic and atraumatic.  Neck: Neck supple.  Cardiovascular: Normal rate and regular rhythm.  Pulmonary/Chest: Effort normal and breath sounds normal. He has no wheezes.  Abdominal: Soft. Bowel sounds are normal. There is no hepatosplenomegaly. There is no tenderness.  Musculoskeletal: He exhibits no edema.  Lymphadenopathy:    He has no cervical adenopathy.  Neurological: He is alert and oriented to person, place, and time.  Skin: Skin is warm and dry.  Psychiatric: He has a normal mood and affect. His behavior is normal.  Vitals reviewed.       Assessment & Plan:    Encounter Diagnoses  Name Primary?  . Hyperlipidemia, unspecified hyperlipidemia type Yes  . Reactive depression (situational)   . Personal history of noncompliance with medical treatment, presenting hazards  to health      -Pt to get labs drawn -Pt to start on citalopram that was prescribed for him at last OV as he says he is still having depression -pt to follow up with eye doctor per their recommendation -pt to follow up 3 weeks for recheck.  RTO sooner prn

## 2018-10-19 ENCOUNTER — Other Ambulatory Visit (HOSPITAL_COMMUNITY)
Admission: RE | Admit: 2018-10-19 | Discharge: 2018-10-19 | Disposition: A | Discharge status: Home / Self Care | Payer: Self-pay | Source: Ambulatory Visit | Attending: Physician Assistant | Admitting: Physician Assistant

## 2018-10-19 DIAGNOSIS — R7303 Prediabetes: Secondary | ICD-10-CM | POA: Insufficient documentation

## 2018-10-19 DIAGNOSIS — Z125 Encounter for screening for malignant neoplasm of prostate: Secondary | ICD-10-CM | POA: Insufficient documentation

## 2018-10-19 DIAGNOSIS — E785 Hyperlipidemia, unspecified: Secondary | ICD-10-CM | POA: Insufficient documentation

## 2018-10-19 LAB — COMPREHENSIVE METABOLIC PANEL
ALT: 40 U/L (ref 0–44)
ANION GAP: 7 (ref 5–15)
AST: 22 U/L (ref 15–41)
Albumin: 4.3 g/dL (ref 3.5–5.0)
Alkaline Phosphatase: 76 U/L (ref 38–126)
BUN: 12 mg/dL (ref 6–20)
CHLORIDE: 104 mmol/L (ref 98–111)
CO2: 25 mmol/L (ref 22–32)
CREATININE: 1.32 mg/dL — AB (ref 0.61–1.24)
Calcium: 9 mg/dL (ref 8.9–10.3)
GLUCOSE: 94 mg/dL (ref 70–99)
Potassium: 3.8 mmol/L (ref 3.5–5.1)
SODIUM: 136 mmol/L (ref 135–145)
Total Bilirubin: 0.7 mg/dL (ref 0.3–1.2)
Total Protein: 8 g/dL (ref 6.5–8.1)

## 2018-10-19 LAB — LIPID PANEL
Cholesterol: 234 mg/dL — ABNORMAL HIGH (ref 0–200)
HDL: 38 mg/dL — ABNORMAL LOW (ref >40)
LDL Cholesterol: 169 mg/dL — ABNORMAL HIGH (ref 0–99)
Total CHOL/HDL Ratio: 6.2 RATIO
Triglycerides: 133 mg/dL (ref <150)
VLDL: 27 mg/dL (ref 0–40)

## 2018-10-19 LAB — HEMOGLOBIN A1C
Hgb A1c MFr Bld: 5.7 % — ABNORMAL HIGH (ref 4.8–5.6)
MEAN PLASMA GLUCOSE: 116.89 mg/dL

## 2018-10-19 LAB — PSA: PROSTATIC SPECIFIC ANTIGEN: 0.58 ng/mL (ref 0.00–4.00)

## 2018-11-14 ENCOUNTER — Encounter: Payer: Self-pay | Admitting: Physician Assistant

## 2018-11-14 ENCOUNTER — Ambulatory Visit: Payer: Self-pay | Admitting: Physician Assistant

## 2018-11-14 ENCOUNTER — Other Ambulatory Visit: Payer: Self-pay | Admitting: Physician Assistant

## 2018-11-14 VITALS — BP 102/69 | HR 74 | Temp 97.0°F | Ht 67.75 in | Wt 185.5 lb

## 2018-11-14 DIAGNOSIS — H5450 Low vision, one eye, unspecified eye: Secondary | ICD-10-CM

## 2018-11-14 DIAGNOSIS — H5440 Blindness, one eye, unspecified eye: Secondary | ICD-10-CM

## 2018-11-14 DIAGNOSIS — Z1211 Encounter for screening for malignant neoplasm of colon: Secondary | ICD-10-CM

## 2018-11-14 DIAGNOSIS — N189 Chronic kidney disease, unspecified: Secondary | ICD-10-CM

## 2018-11-14 DIAGNOSIS — F17200 Nicotine dependence, unspecified, uncomplicated: Secondary | ICD-10-CM

## 2018-11-14 DIAGNOSIS — H5711 Ocular pain, right eye: Secondary | ICD-10-CM

## 2018-11-14 DIAGNOSIS — E785 Hyperlipidemia, unspecified: Secondary | ICD-10-CM

## 2018-11-14 MED ORDER — LISINOPRIL 2.5 MG PO TABS: 2.5000 mg | ORAL_TABLET | Freq: Every day | ORAL | 4 refills | Status: DC

## 2018-11-14 MED ORDER — SIMVASTATIN 20 MG PO TABS: 20.0000 mg | ORAL_TABLET | Freq: Every day | ORAL | 4 refills | Status: DC

## 2018-11-14 NOTE — Progress Notes (Signed)
BP 102/69 (BP Location: Right Arm, Patient Position: Sitting, Cuff Size: Normal)   Pulse 74   Temp (!) 97 F (36.1 C) (Other (Comment))   Ht 5' 7.75" (1.721 m)   Wt 185 lb 8 oz (84.1 kg)   SpO2 100%   BMI 28.41 kg/m    Subjective:    Patient ID: Kevin Moreno, male    DOB: Mar 31, 1966, 53 y.o.   MRN: [id number]  HPI: Kevin Moreno is a 53 y.o. male presenting on 11/14/2018 for Mental Health Problem   HPI   Pt not taking the citalopram.  He says he is not having depression any longer  He has appt to f/u with eye doctor in February at [facility]  Relevant past medical, surgical, family and social history reviewed and updated as indicated. Interim medical history since our last visit reviewed. Allergies and medications reviewed and updated.  Review of Systems  Constitutional: Negative for appetite change, chills, diaphoresis, fatigue, fever and unexpected weight change.  HENT: Negative for congestion, dental problem, drooling, ear pain, facial swelling, hearing loss, mouth sores, sneezing, sore throat, trouble swallowing and voice change.   Eyes: Positive for pain, discharge, redness and itching. Negative for visual disturbance.  Respiratory: Positive for cough. Negative for choking, shortness of breath and wheezing.   Cardiovascular: Negative for chest pain, palpitations and leg swelling.  Gastrointestinal: Negative for abdominal pain, blood in stool, constipation, diarrhea and vomiting.  Endocrine: Negative for cold intolerance, heat intolerance and polydipsia.  Genitourinary: Negative for decreased urine volume, dysuria and hematuria.  Musculoskeletal: Negative for arthralgias, back pain and gait problem.  Skin: Negative for rash.  Allergic/Immunologic: Negative for environmental allergies.  Neurological: Positive for headaches. Negative for seizures, syncope and light-headedness.  Hematological: Negative for adenopathy.  Psychiatric/Behavioral: Negative for agitation,  dysphoric mood and suicidal ideas. The patient is not nervous/anxious.     Per HPI unless specifically indicated above     Objective:    BP 102/69 (BP Location: Right Arm, Patient Position: Sitting, Cuff Size: Normal)   Pulse 74   Temp (!) 97 F (36.1 C) (Other (Comment))   Ht 5' 7.75" (1.721 m)   Wt 185 lb 8 oz (84.1 kg)   SpO2 100%   BMI 28.41 kg/m   Wt Readings from Last 3 Encounters:  11/14/18 185 lb 8 oz (84.1 kg)  10/17/18 182 lb 8 oz (82.8 kg)  10/03/18 180 lb 8 oz (81.9 kg)    Physical Exam Vitals signs reviewed.  Constitutional:      Appearance: He is well-developed.  HENT:     Head: Normocephalic and atraumatic.  Neck:     Musculoskeletal: Neck supple.  Cardiovascular:     Rate and Rhythm: Normal rate and regular rhythm.  Pulmonary:     Effort: Pulmonary effort is normal.     Breath sounds: Normal breath sounds. No wheezing.  Abdominal:     General: Bowel sounds are normal.     Palpations: Abdomen is soft.     Tenderness: There is no abdominal tenderness.  Lymphadenopathy:     Cervical: No cervical adenopathy.  Skin:    General: Skin is warm and dry.  Neurological:     Mental Status: He is alert and oriented to person, place, and time.  Psychiatric:        Behavior: Behavior normal.     Results for orders placed or performed during the hospital encounter of 10/19/18  PSA  Result Value Ref Range  Prostatic Specific Antigen 0.58 0.00 - 4.00 ng/mL  Lipid panel  Result Value Ref Range   Cholesterol 234 (H) 0 - 200 mg/dL   Triglycerides 373 <428 mg/dL   HDL 38 (L) >76 mg/dL   Total CHOL/HDL Ratio 6.2 RATIO   VLDL 27 0 - 40 mg/dL   LDL Cholesterol 811 (H) 0 - 99 mg/dL  Comprehensive metabolic panel  Result Value Ref Range   Sodium 136 135 - 145 mmol/L   Potassium 3.8 3.5 - 5.1 mmol/L   Chloride 104 98 - 111 mmol/L   CO2 25 22 - 32 mmol/L   Glucose, Bld 94 70 - 99 mg/dL   BUN 12 6 - 20 mg/dL   Creatinine, Ser 5.72 (H) 0.61 - 1.24 mg/dL    Calcium 9.0 8.9 - 62.0 mg/dL   Total Protein 8.0 6.5 - 8.1 g/dL   Albumin 4.3 3.5 - 5.0 g/dL   AST 22 15 - 41 U/L   ALT 40 0 - 44 U/L   Alkaline Phosphatase 76 38 - 126 U/L   Total Bilirubin 0.7 0.3 - 1.2 mg/dL   GFR calc non Af Amer >60 >60 mL/min   GFR calc Af Amer >60 >60 mL/min   Anion gap 7 5 - 15  Hemoglobin A1c  Result Value Ref Range   Hgb A1c MFr Bld 5.7 (H) 4.8 - 5.6 %   Mean Plasma Glucose 116.89 mg/dL      Assessment & Plan:   Encounter Diagnoses  Name Primary?  . Hyperlipidemia, unspecified hyperlipidemia type Yes  . Chronic kidney disease, unspecified CKD stage   . 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 Ocular pain, right eye   . Screening for colon cancer   . Tobacco use disorder      -reviewed labs with pt -Gave pt information for financial assistance at [facility].  -start Simvastatin and counseled pt lowfat diet -start Low dose ACEI for elevated cr and monitor -pt was given iFOBT for colon cancer screening -counseled smoking cessation -pt to follow up 3 months.  RTO sooner prn

## 2018-11-14 NOTE — Patient Instructions (Addendum)
Chronic Kidney Disease, Adult Chronic kidney disease (CKD) happens when the kidneys are damaged over a long period of time. The kidneys are two organs that help with:  Getting rid of waste and extra fluid from the blood.  Making hormones that maintain the amount of fluid in your tissues and blood vessels.  Making sure that the body has the right amount of fluids and chemicals. Most of the time, CKD does not go away, but it can usually be controlled. Steps must be taken to slow down the kidney damage or to stop it from getting worse. If this is not done, the kidneys may stop working. Follow these instructions at home: Medicines  Take over-the-counter and prescription medicines only as told by your doctor. You may need to change the amount of medicines you take.  Do not take any new medicines unless your doctor says it is okay. Many medicines can make your kidney damage worse.  Do not take any vitamin and supplements unless your doctor says it is okay. Many vitamins and supplements can make your kidney damage worse. General instructions  Follow a diet as told by your doctor. You may need to stay away from: ? Alcohol. ? Salty foods. ? Foods that are high in:  Potassium.  Calcium.  Protein.  Do not use any products that contain nicotine or tobacco, such as cigarettes and e-cigarettes. If you need help quitting, ask your doctor.  Keep track of your blood pressure at home. Tell your doctor about any changes.  If you have diabetes, keep track of your blood sugar as told by your doctor.  Try to stay at a healthy weight. If you need help, ask your doctor.  Exercise at least 30 minutes a day, 5 days a week.  Stay up-to-date with your shots (immunizations) as told by your doctor.  Keep all follow-up visits as told by your doctor. This is important. Contact a doctor if:  Your symptoms get worse.  You have new symptoms. Get help right away if:  You have symptoms of end-stage  kidney disease. These may include: ? Headaches. ? Numbness in your hands or feet. ? Easy bruising. ? Having hiccups often. ? Chest pain. ? Shortness of breath. ? Stopping of menstrual periods in women.  You have a fever.  You have very little pee (urine).  You have pain or bleeding when you pee. Summary  Chronic kidney disease (CKD) happens when the kidneys are damaged over a long period of time.  Most of the time, this condition does not go away, but it can usually be controlled. Steps must be taken to slow down the kidney damage or to stop it from getting worse.  Treatment may include a combination of medicines and lifestyle changes. This information is not intended to replace advice given to you by your health care provider. Make sure you discuss any questions you have with your health care provider. Document Released: 01/19/2010 Document Revised: 11/29/2016 Document Reviewed: 11/29/2016 Elsevier Interactive Patient Education  2019 [employer].  ---------------------------------------------------------------------------   Cholesterol Cholesterol is a white, waxy, fat-like substance that is needed by the human body in small amounts. The liver makes all the cholesterol we need. Cholesterol is carried from the liver by the blood through the blood vessels. Deposits of cholesterol (plaques) may build up on blood vessel (artery) walls. Plaques make the arteries narrower and stiffer. Cholesterol plaques increase the risk for heart attack and stroke. You cannot feel your cholesterol level even if it  is very high. The only way to know that it is high is to have a blood test. Once you know your cholesterol levels, you should keep a record of the test results. Work with your health care provider to keep your levels in the desired range. What do the results mean?  Total cholesterol is a rough measure of all the cholesterol in your blood.  LDL (low-density lipoprotein) is the "bad"  cholesterol. This is the type that causes plaque to build up on the artery walls. You want this level to be low.  HDL (high-density lipoprotein) is the "good" cholesterol because it cleans the arteries and carries the LDL away. You want this level to be high.  Triglycerides are fat that the body can either burn for energy or store. High levels are closely linked to heart disease. What are the desired levels of cholesterol?  Total cholesterol below 200.  LDL below 100 for people who are at risk, below 70 for people at very high risk.  HDL above 40 is good. A level of 60 or higher is considered to be protective against heart disease.  Triglycerides below 150. How can I lower my cholesterol? Diet Follow your diet program as told by your health care provider.  Choose fish or white meat chicken and [place], roasted or baked. Limit fatty cuts of red meat, fried foods, and processed meats, such as sausage and lunch meats.  Eat lots of fresh fruits and vegetables.  Choose whole grains, beans, pasta, potatoes, and cereals.  Choose olive oil, corn oil, or canola oil, and use only small amounts.  Avoid butter, mayonnaise, shortening, or palm kernel oils.  Avoid foods with trans fats.  Drink skim or nonfat milk and eat low-fat or nonfat yogurt and cheeses. Avoid whole milk, cream, ice cream, egg yolks, and full-fat cheeses.  Healthier desserts include angel food cake, ginger snaps, animal crackers, hard candy, popsicles, and low-fat or nonfat frozen yogurt. Avoid pastries, cakes, pies, and cookies.  Exercise  Follow your exercise program as told by your health care provider. A regular program: ? Helps to decrease LDL and raise HDL. ? Helps with weight control.  Do things that increase your activity level, such as gardening, walking, and taking the stairs.  Ask your health care provider about ways that you can be more active in your daily life. Medicine  Take over-the-counter and  prescription medicines only as told by your health care provider. ? Medicine may be prescribed by your health care provider to help lower cholesterol and decrease the risk for heart disease. This is usually done if diet and exercise have failed to bring down cholesterol levels. ? If you have several risk factors, you may need medicine even if your levels are normal. This information is not intended to replace advice given to you by your health care provider. Make sure you discuss any questions you have with your health care provider. Document Released: 07/20/2001 Document Revised: 05/22/2016 Document Reviewed: 04/24/2016 Elsevier Interactive Patient Education  2019 [employer].  ---------------------------------------------------   Financial assistance at [facility]  [phone] or [phone]

## 2018-11-15 LAB — IFOBT (OCCULT BLOOD): IFOBT: NEGATIVE

## 2019-02-20 ENCOUNTER — Ambulatory Visit: Payer: Self-pay | Admitting: Physician Assistant

## 2019-03-19 ENCOUNTER — Encounter: Payer: Self-pay | Admitting: Physician Assistant

## 2019-03-19 ENCOUNTER — Ambulatory Visit: Payer: Medicaid Other | Admitting: Physician Assistant

## 2019-03-19 DIAGNOSIS — H5440 Blindness, one eye, unspecified eye: Secondary | ICD-10-CM

## 2019-03-19 DIAGNOSIS — E785 Hyperlipidemia, unspecified: Secondary | ICD-10-CM

## 2019-03-19 DIAGNOSIS — F17200 Nicotine dependence, unspecified, uncomplicated: Secondary | ICD-10-CM

## 2019-03-19 NOTE — Progress Notes (Signed)
   There were no vitals taken for this visit.   Subjective:    Patient ID: Kevin Moreno, male    DOB: [birth year], 53 y.o.   MRN: [id number]  HPI: Kevin Moreno is a 53 y.o. male presenting on 03/19/2019 for No chief complaint on file.   HPI  This is a telemedicine visit due to coronavirus pandemic.  It is via telephone as pt does not have a smartphone.    I connected with  Arman Filter on 03/19/19 by a video enabled telemedicine application and verified that I am speaking with the correct person using two identifiers.   I discussed the limitations of evaluation and management by telemedicine. The patient expressed understanding and agreed to proceed.  Pt says he is still smoking  He is not having any problems exept his eye still bothers him.   He went to [facility] for this but didnt't return for his follow up due to cost.    Relevant past medical, surgical, family and social history reviewed and updated as indicated. Interim medical history since our last visit reviewed. Allergies and medications reviewed and updated.   Current Outpatient Medications:  .  hypromellose (GENTEAL) 0.3 % GEL ophthalmic ointment, Place 1 application into both eyes. Four times a day, Disp: , Rfl:  .  ibuprofen (ADVIL) 200 MG tablet, Take 200 mg by mouth every 6 (six) hours as needed., Disp: , Rfl:  .  lisinopril (ZESTRIL) 2.5 MG tablet, Take 1 tablet (2.5 mg total) by mouth daily., Disp: 30 tablet, Rfl: 4 .  simvastatin (ZOCOR) 20 MG tablet, Take 1 tablet (20 mg total) by mouth at bedtime., Disp: 30 tablet, Rfl: 4   Review of Systems  Per HPI unless specifically indicated above     Objective:    There were no vitals taken for this visit.  Wt Readings from Last 3 Encounters:  11/14/18 185 lb 8 oz (84.1 kg)  10/17/18 182 lb 8 oz (82.8 kg)  10/03/18 180 lb 8 oz (81.9 kg)    Physical Exam Pulmonary:     Effort: Pulmonary effort is normal. No respiratory distress.  Neurological:     Mental Status: He is alert and oriented to person, place, and time.  Psychiatric:        Attention and Perception: Attention normal.        Speech: Speech normal.        Behavior: Behavior is cooperative.         Assessment & Plan:    Encounter Diagnoses  Name Primary?  . Hyperlipidemia, unspecified hyperlipidemia type Yes  . 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 Tobacco use disorder      -He is following guidelines with staying home most of times and wears maks when he goes out. Marland Kitchen  He is encouraged to continue these safety recomendations  -Pt was given zenni website so he can look into getting new glasses  -Gave pt information to contact [facility] financial office to request assistance  -counseled smoking cessation  -pt to follow up here 2 months with repeat labs before appointment.  He is to contact office sooner prn

## 2019-05-22 ENCOUNTER — Other Ambulatory Visit: Payer: Self-pay

## 2019-05-22 ENCOUNTER — Ambulatory Visit: Payer: Medicaid Other | Admitting: Physician Assistant

## 2019-05-22 ENCOUNTER — Other Ambulatory Visit (HOSPITAL_COMMUNITY)
Admission: RE | Admit: 2019-05-22 | Discharge: 2019-05-22 | Disposition: A | Discharge status: Home / Self Care | Payer: Self-pay | Source: Ambulatory Visit | Attending: Physician Assistant | Admitting: Physician Assistant

## 2019-05-22 DIAGNOSIS — E785 Hyperlipidemia, unspecified: Secondary | ICD-10-CM | POA: Insufficient documentation

## 2019-05-22 DIAGNOSIS — N189 Chronic kidney disease, unspecified: Secondary | ICD-10-CM | POA: Insufficient documentation

## 2019-05-22 LAB — LIPID PANEL
Cholesterol: 180 mg/dL (ref 0–200)
HDL: 36 mg/dL — ABNORMAL LOW (ref >40)
LDL Cholesterol: 115 mg/dL — ABNORMAL HIGH (ref 0–99)
Total CHOL/HDL Ratio: 5 RATIO
Triglycerides: 143 mg/dL (ref <150)
VLDL: 29 mg/dL (ref 0–40)

## 2019-05-22 LAB — COMPREHENSIVE METABOLIC PANEL
ALT: 46 U/L — ABNORMAL HIGH (ref 0–44)
AST: 28 U/L (ref 15–41)
Albumin: 4.3 g/dL (ref 3.5–5.0)
Alkaline Phosphatase: 76 U/L (ref 38–126)
Anion gap: 8 (ref 5–15)
BUN: 12 mg/dL (ref 6–20)
CO2: 26 mmol/L (ref 22–32)
Calcium: 9.3 mg/dL (ref 8.9–10.3)
Chloride: 104 mmol/L (ref 98–111)
Creatinine, Ser: 1.26 mg/dL — ABNORMAL HIGH (ref 0.61–1.24)
GFR calc Af Amer: >60 mL/min (ref >60)
GFR calc non Af Amer: >60 mL/min (ref >60)
Glucose, Bld: 101 mg/dL — ABNORMAL HIGH (ref 70–99)
Potassium: 3.9 mmol/L (ref 3.5–5.1)
Sodium: 138 mmol/L (ref 135–145)
Total Bilirubin: 0.7 mg/dL (ref 0.3–1.2)
Total Protein: 7.9 g/dL (ref 6.5–8.1)

## 2019-05-23 ENCOUNTER — Encounter: Payer: Self-pay | Admitting: Physician Assistant

## 2019-05-23 ENCOUNTER — Ambulatory Visit: Payer: Medicaid Other | Admitting: Physician Assistant

## 2019-05-23 DIAGNOSIS — N189 Chronic kidney disease, unspecified: Secondary | ICD-10-CM

## 2019-05-23 DIAGNOSIS — E785 Hyperlipidemia, unspecified: Secondary | ICD-10-CM

## 2019-05-23 DIAGNOSIS — F17200 Nicotine dependence, unspecified, uncomplicated: Secondary | ICD-10-CM

## 2019-05-23 DIAGNOSIS — H5410 Blindness, one eye, low vision other eye, unspecified eyes: Secondary | ICD-10-CM

## 2019-05-23 DIAGNOSIS — H5440 Blindness, one eye, unspecified eye: Secondary | ICD-10-CM

## 2019-05-23 NOTE — Progress Notes (Signed)
There were no vitals taken for this visit.   Subjective:    Patient ID: Kevin Moreno, male    DOB: [birth year], 53 y.o.   MRN: [id number]  HPI: Kevin Moreno is a 53 y.o. male presenting on 05/23/2019 for No chief complaint on file.   HPI    This is a telemedicine appointment due to coronavirus pandemic.  It is via telephone as pt does not have a phone or computer with video capabilities  I connected with  Kevin Moreno on 05/23/19 by a video enabled telemedicine application and verified that I am speaking with the correct person using two identifiers.   I discussed the limitations of evaluation and management by telemedicine. The patient expressed understanding and agreed to proceed.  Pt is at home.  Provider is at office.     Pt had CV19 test in [place]-  He says the Test was done on Thursday last week.  He says he had no symptoms or exposures, he just wanted to get tested.    He is walking for exercise.  He wears a mask when he goes out and avoids crowds  He says he has No anxiety or depression.  He continues to smoke.  He went to the eye specialist at [facility] but did not go for follow up.     Relevant past medical, surgical, family and social history reviewed and updated as indicated. Interim medical history since our last visit reviewed. Allergies and medications reviewed and updated.   Current Outpatient Medications:  .  hypromellose (GENTEAL) 0.3 % GEL ophthalmic ointment, Place 1 application into both eyes. Four times a day, Disp: , Rfl:  .  lisinopril (ZESTRIL) 2.5 MG tablet, Take 1 tablet (2.5 mg total) by mouth daily., Disp: 30 tablet, Rfl: 4 .  simvastatin (ZOCOR) 20 MG tablet, Take 1 tablet (20 mg total) by mouth at bedtime., Disp: 30 tablet, Rfl: 4   Review of Systems  Per HPI unless specifically indicated above     Objective:    There were no vitals taken for this visit.  Wt Readings from Last 3 Encounters:  11/14/18 185 lb 8 oz  (84.1 kg)  10/17/18 182 lb 8 oz (82.8 kg)  10/03/18 180 lb 8 oz (81.9 kg)    Physical Exam Pulmonary:     Effort: No respiratory distress.  Neurological:     Mental Status: He is alert and oriented to person, place, and time.  Psychiatric:        Attention and Perception: Attention normal.        Speech: Speech normal.        Behavior: Behavior is cooperative.     Results for orders placed or performed during the hospital encounter of 05/22/19  Lipid panel  Result Value Ref Range   Cholesterol 180 0 - 200 mg/dL   Triglycerides 143 <150 mg/dL   HDL 36 (L) >40 mg/dL   Total CHOL/HDL Ratio 5.0 RATIO   VLDL 29 0 - 40 mg/dL   LDL Cholesterol 115 (H) 0 - 99 mg/dL  Comprehensive metabolic panel  Result Value Ref Range   Sodium 138 135 - 145 mmol/L   Potassium 3.9 3.5 - 5.1 mmol/L   Chloride 104 98 - 111 mmol/L   CO2 26 22 - 32 mmol/L   Glucose, Bld 101 (H) 70 - 99 mg/dL   BUN 12 6 - 20 mg/dL   Creatinine, Ser 1.26 (H) 0.61 - 1.24 mg/dL   Calcium  9.3 8.9 - 10.3 mg/dL   Total Protein 7.9 6.5 - 8.1 g/dL   Albumin 4.3 3.5 - 5.0 g/dL   AST 28 15 - 41 U/L   ALT 46 (H) 0 - 44 U/L   Alkaline Phosphatase 76 38 - 126 U/L   Total Bilirubin 0.7 0.3 - 1.2 mg/dL   GFR calc non Af Amer >60 >60 mL/min   GFR calc Af Amer >60 >60 mL/min   Anion gap 8 5 - 15      Assessment & Plan:    Encounter Diagnoses  Name Primary?  . Hyperlipidemia, unspecified hyperlipidemia type Yes  . 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 Tobacco use disorder   . Chronic kidney disease, unspecified CKD stage        -reviewed labs with pt  -pt to continue current medications  -counseled pt that he Needs to follow up with eye doctor- he is told to call for appointment and he says he will  -Will monitor renal function.  Pt counseled to avoid NSAIDs  -Pt to follow up here 3 months

## 2019-07-23 ENCOUNTER — Emergency Department (HOSPITAL_COMMUNITY)
Admission: EM | Admit: 2019-07-23 | Discharge: 2019-07-23 | Disposition: A | Discharge status: Home / Self Care | Payer: Self-pay | Attending: Emergency Medicine | Admitting: Emergency Medicine

## 2019-07-23 ENCOUNTER — Emergency Department (HOSPITAL_COMMUNITY): Payer: Self-pay

## 2019-07-23 ENCOUNTER — Other Ambulatory Visit: Payer: Self-pay

## 2019-07-23 ENCOUNTER — Encounter (HOSPITAL_COMMUNITY): Payer: Self-pay

## 2019-07-23 DIAGNOSIS — L03213 Periorbital cellulitis: Secondary | ICD-10-CM | POA: Insufficient documentation

## 2019-07-23 DIAGNOSIS — F17210 Nicotine dependence, cigarettes, uncomplicated: Secondary | ICD-10-CM | POA: Insufficient documentation

## 2019-07-23 LAB — CBC WITH DIFFERENTIAL/PLATELET
Abs Immature Granulocytes: 0 10*3/uL (ref 0.00–0.07)
Basophils Absolute: 0 10*3/uL (ref 0.0–0.1)
Basophils Relative: 0 %
Eosinophils Absolute: 0.1 10*3/uL (ref 0.0–0.5)
Eosinophils Relative: 1 %
HCT: 49.4 % (ref 39.0–52.0)
Hemoglobin: 15.5 g/dL (ref 13.0–17.0)
Immature Granulocytes: 0 %
Lymphocytes Relative: 45 %
Lymphs Abs: 1.6 10*3/uL (ref 0.7–4.0)
MCH: 29.2 pg (ref 26.0–34.0)
MCHC: 31.4 g/dL (ref 30.0–36.0)
MCV: 93 fL (ref 80.0–100.0)
Monocytes Absolute: 0.5 10*3/uL (ref 0.1–1.0)
Monocytes Relative: 13 %
Neutro Abs: 1.5 10*3/uL — ABNORMAL LOW (ref 1.7–7.7)
Neutrophils Relative %: 41 %
Platelets: 181 10*3/uL (ref 150–400)
RBC: 5.31 MIL/uL (ref 4.22–5.81)
RDW: 13.2 % (ref 11.5–15.5)
WBC: 3.7 10*3/uL — ABNORMAL LOW (ref 4.0–10.5)
nRBC: 0 % (ref 0.0–0.2)

## 2019-07-23 LAB — BASIC METABOLIC PANEL
Anion gap: 7 (ref 5–15)
BUN: 10 mg/dL (ref 6–20)
CO2: 26 mmol/L (ref 22–32)
Calcium: 9.5 mg/dL (ref 8.9–10.3)
Chloride: 102 mmol/L (ref 98–111)
Creatinine, Ser: 1.23 mg/dL (ref 0.61–1.24)
GFR calc Af Amer: >60 mL/min (ref >60)
GFR calc non Af Amer: >60 mL/min (ref >60)
Glucose, Bld: 86 mg/dL (ref 70–99)
Potassium: 3.8 mmol/L (ref 3.5–5.1)
Sodium: 135 mmol/L (ref 135–145)

## 2019-07-23 MED ORDER — FLUORESCEIN SODIUM 1 MG OP STRP
1.0000 | ORAL_STRIP | Freq: Once | OPHTHALMIC | Status: AC
  Administered 2019-07-23: 1 via OPHTHALMIC
  Filled 2019-07-23: qty 1

## 2019-07-23 MED ORDER — TETRACAINE HCL 0.5 % OP SOLN
2.0000 [drp] | Freq: Once | OPHTHALMIC | Status: AC
  Administered 2019-07-23: 2 [drp] via OPHTHALMIC
  Filled 2019-07-23: qty 4

## 2019-07-23 MED ORDER — CLINDAMYCIN HCL 150 MG PO CAPS: 150.0000 mg | ORAL_CAPSULE | Freq: Four times a day (QID) | ORAL | 0 refills | Status: DC

## 2019-07-23 MED ORDER — CLINDAMYCIN PHOSPHATE 600 MG/50ML IV SOLN
600.0000 mg | Freq: Once | INTRAVENOUS | Status: AC
  Administered 2019-07-23: 600 mg via INTRAVENOUS
  Filled 2019-07-23: qty 50

## 2019-07-23 MED ORDER — TOBRAMYCIN 0.3 % OP SOLN
2.0000 [drp] | Freq: Once | OPHTHALMIC | Status: AC
  Administered 2019-07-23: 2 [drp] via OPHTHALMIC
  Filled 2019-07-23: qty 5

## 2019-07-23 MED ORDER — ERYTHROMYCIN 5 MG/GM OP OINT: TOPICAL_OINTMENT | OPHTHALMIC | 0 refills | Status: DC

## 2019-07-23 MED ORDER — IOHEXOL 300 MG/ML  SOLN
75.0000 mL | Freq: Once | INTRAMUSCULAR | Status: AC | PRN
  Administered 2019-07-23: 75 mL via INTRAVENOUS

## 2019-07-23 NOTE — ED Triage Notes (Signed)
Pt presents to ED with complaints of left eye pain. Pt states he woke up this am and his eye was swollen and sore. Pt states this is a chronic issue and causes him to have headaches as well.

## 2019-07-23 NOTE — Discharge Instructions (Addendum)
Contact a doctor if: Your eyelids feel hot. You have blisters on your eyelids. You have a rash on your eyelids. The swelling does not go away in 2-4 days. The swelling gets worse. Get help right away if: You have pain that gets worse. You have pain that spreads to other parts of your face. You have redness that gets worse. You have redness that spreads to other parts of your face. Your vision changes. You have pain when you look at lights or things that move. You have a fever. Summary Blepharitis is swelling of the eyelids. Pay attention to any changes in how your eyes look or feel. Tell your doctor about any changes. Follow home care instructions as told by your doctor. Wash your hands often. Avoid wearing makeup. Do not rub your eyes. Use warm compresses, creams, or eye drops as told by your doctor. Let your doctor know if you have changes in vision, blisters or rash on eyelids, pain that spreads to your face, or warmth on your eyelids.  Please use clindamycin with breakfast, lunch, dinner, and at bedtime.  Please use 2 drops of tobramycin to the left eye every 4 hours over the next 5 days.  Please arrange an appointment with your ophthalmology specialist as soon as possible.  Please return to the emergency department if there is worsening of the swelling, worsening of the redness around the eye, nausea, vomiting, excessive headache, changes in your symptoms, or changes in your condition.

## 2019-07-23 NOTE — ED Provider Notes (Signed)
Received patient is signout at shift change.   Sense of memory patient is a 53 year old male who presents to the emergency department department for evaluation of the left eye.  On examination the left eyelid was swollen and tender to touch.  Vital signs are within normal limits.  CT scan is pending to eval for possible orbital cellulitis.  CT scan of the orbits with contrast shows left periorbital soft tissue swelling compatible with cellulitis.  There is no significant retro-orbital inflammatory change.  There is noted a calcified lens in the left globe.  There is no significant post septal inflammation changes on the left.  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  The patient was treated with IV clindamycin.  The patient is also treated with tobramycin ophthalmic drops.  The patient will be placed on oral clindamycin.  The patient is to follow-up with his ophthalmology specialist in [place].  The patient will return to the emergency department if any worsening of his symptoms, changes in his condition, problems or concerns.   Dx: Preseptal cellulitis   Lily Kocher, PA-C 07/23/19 1826    Fredia Sorrow, MD 07/30/19 [phone]

## 2019-07-23 NOTE — ED Provider Notes (Signed)
[facility] Provider Note   CSN: [id number] Arrival date & time: 07/23/19  1231   History   Chief Complaint Chief Complaint  Patient presents with  . Eye Pain   HPI Kevin Moreno is a 53 y.o. male with past medical history significant for cataracts in the left eye, total detached retina, blindness in left eye who presents for evaluation of left eye pain.  Patient states he woke up this morning with left eyelid swollen and tender to touch.  Patient states he has had this previously however does not know what he was diagnosed with.  Patient states he has intermittently had headache to this area however does not have one currently.  He last saw an ophthalmologist 6 months ago who has persistently told him he needs to have his left eye removed due to his large cataract and detached retina.  Patient states he does not want this at this time.  Denies foreign body sensation.  Denies trauma to the eye.  Denies fever, chills, nausea, vomiting, congestion, rhinorrhea, facial erythema, rashes, lesions, neck pain, neck stiffness, weakness, chest pain, shortness of breath.  Denies sudden onset thunderclap headache.  Has not taken anything for symptoms.  History obtained from patient and past medical records.  No interpreter was used.     HPI  Past Medical History:  Diagnosis Date  . Back pain   . Blind left eye   . Cataract    Left eye  . Depression   . Dizzy spells     Patient Active Problem List   Diagnosis Date Noted  . Hyperlipemia 10/13/2015  . Cigarette nicotine dependence with nicotine-induced disorder 09/10/2015  . Solitary pulmonary nodule 09/10/2015    Past Surgical History:  Procedure Laterality Date  . APPENDECTOMY          Home Medications    Prior to Admission medications   Medication Sig Start Date End Date Taking? Authorizing Provider  erythromycin ophthalmic ointment Place a 1/2 inch ribbon of ointment into the lower eyelid. 07/23/19    Zi Sek A, PA-C  hypromellose (GENTEAL) 0.3 % GEL ophthalmic ointment Place 1 application into both eyes. Four times a day    [provider]  lisinopril (ZESTRIL) 2.5 MG tablet Take 1 tablet (2.5 mg total) by mouth daily. 11/14/18   Soyla Dryer, PA-C  simvastatin (ZOCOR) 20 MG tablet Take 1 tablet (20 mg total) by mouth at bedtime. 11/14/18   Soyla Dryer, PA-C    Family History Family History  Problem Relation Age of Onset  . Diabetes Mother   . Hypertension Mother   . Diabetes Maternal Aunt   . Hypertension Maternal Aunt   . Cancer Maternal Aunt   . Diabetes Maternal Grandmother   . Hypertension Maternal Grandmother     Social History Social History   Tobacco Use  . Smoking status: Current Every Day Smoker    Packs/day: 0.25    Years: 21.00    Pack years: 5.25    Types: Cigarettes  . Smokeless tobacco: Never Used  Substance Use Topics  . Alcohol use: No  . Drug use: No     Allergies   Pollen extract   Review of Systems Review of Systems  Constitutional: Negative.   HENT: Negative.   Eyes: Positive for pain and itching. Negative for photophobia, discharge, redness and visual disturbance.  Respiratory: Negative.   Cardiovascular: Negative.   Gastrointestinal: Negative.   Genitourinary: Negative.   Musculoskeletal: Negative.   Skin:  Negative.   Neurological: Negative.   All other systems reviewed and are negative.  Physical Exam Updated Vital Signs BP 121/82 (BP Location: Right Arm)   Pulse 76   Temp 98.3 F (36.8 C) (Oral)   Resp 10   Ht 5\' 6"  (1.676 m)   Wt 72.6 kg   SpO2 97%   BMI 25.82 kg/m   Physical Exam Vitals signs and nursing note reviewed.  Constitutional:      General: He is not in acute distress.    Appearance: He is well-developed. He is not ill-appearing, toxic-appearing or diaphoretic.  HENT:     Head: Normocephalic and atraumatic.     Jaw: There is normal jaw occlusion.     Comments: Mild erythema with  exudate to left upper eyelid.  No additional surrounding erythema, warmth or swelling to face.    Nose: Nose normal.     Mouth/Throat:     Comments: Posterior oropharynx clear.  Mucous membranes moist. Eyes:     General: Lids are normal. Lids are everted, no foreign bodies appreciated. No allergic shiner.       Right eye: No foreign body, discharge or hordeolum.        Left eye: Discharge present.No foreign body or hordeolum.     Intraocular pressure: Right eye pressure is 14 mmHg. Left eye pressure is 6 mmHg.     Extraocular Movements: Extraocular movements intact.     Right eye: Normal extraocular motion and no nystagmus.     Left eye: Normal extraocular motion and no nystagmus.     Conjunctiva/sclera: Conjunctivae normal.     Pupils:     Right eye: Pupil is round, reactive and not sluggish. No corneal abrasion or fluorescein uptake. Seidel exam negative.     Left eye: No corneal abrasion or fluorescein uptake.     Funduscopic exam:       Left eye: No hemorrhage, exudate, AV nicking, arteriolar narrowing or papilledema. Venous pulsations present.    Slit lamp exam:    Right eye: Anterior chamber quiet.     Left eye: No corneal ulcer.     Comments: Large white cataract to left eye.  EOMs intact.  Eyelids inverted without foreign body.  Patient with erythema and discharge to moving glands to left upper eyelid.  Patient with total blindness left eye.  Funduscopic exam with total detached retina to left eye consistent with previous exam findings.  Neck:     Musculoskeletal: Full passive range of motion without pain, normal range of motion and neck supple.     Trachea: Phonation normal.  Cardiovascular:     Rate and Rhythm: Normal rate and regular rhythm.     Pulses: Normal pulses.     Heart sounds: Normal heart sounds.  Pulmonary:     Effort: Pulmonary effort is normal. No respiratory distress.     Breath sounds: Normal breath sounds and air entry.  Abdominal:     General: Bowel sounds  are normal. There is no distension.     Palpations: Abdomen is soft.     Tenderness: There is generalized abdominal tenderness. There is no right CVA tenderness, left CVA tenderness, guarding or rebound.  Musculoskeletal: Normal range of motion.     Comments: Moves all 4 extremities without difficulty.  Skin:    General: Skin is warm and dry.  Neurological:     General: No focal deficit present.     Mental Status: He is alert.     Cranial  Nerves: Cranial nerves are intact.     Sensory: Sensation is intact.     Motor: Motor function is intact.     Coordination: Coordination is intact.     Gait: Gait is intact.     Comments: Negative Romberg, negative heel-to-shin.  No facial droop.  Cranial nerves II through XII grossly intact.  Phonation normal.  No focal deficit.  5 5 strength bilateral upper and lower extremities.    ED Treatments / Results  Labs (all labs ordered are listed, but only abnormal results are displayed) Labs Reviewed  CBC WITH DIFFERENTIAL/PLATELET - Abnormal; Notable for the following components:      Result Value   WBC 3.7 (*)    Neutro Abs 1.5 (*)    All other components within normal limits  BASIC METABOLIC PANEL    EKG None  Radiology No results found.  Procedures Procedures (including critical care time)  Medications Ordered in ED Medications  tetracaine (PONTOCAINE) 0.5 % ophthalmic solution 2 drop (2 drops Both Eyes Given by Other 07/23/19 1628)  fluorescein ophthalmic strip 1 strip (1 strip Left Eye Given 07/23/19 1628)  iohexol (OMNIPAQUE) 300 MG/ML solution 75 mL (75 mLs Intravenous Contrast Given 07/23/19 1649)   Initial Impression / Assessment and Plan / ED Course  I have reviewed the triage vital signs and the nursing notes.  Pertinent labs & imaging results that were available during my care of the patient were reviewed by me and considered in my medical decision making (see chart for details).  53 year old male appears otherwise well  presents for evaluation of eyelid swelling and pain which began this morning.  Afebrile, nonseptic, non-ill-appearing.  He does have history of total of detached retina, large cataract in total blindness in his left eye.  No actual eye pain itself however more so involved his eyelid.  He does have some mild swelling, erythema and crusting to his mobile glands on his left upper lid.  Eyelids everted without any evidence of foreign body.  Fluorescein stain negative to bilateral eyes.  IOP 6 in the left eye, 14 right eye-this is consistent with his prior ophthalmology exams in his medical records.  From prior medical review it seems patient has eye exam is similar to when he was last seen in November 2019.  No pain to the actual eye itself.  Seems to be more eyelid involvement.  Will obtain CT scan orbits with contrast to rule out orbital cellulitis however exam likely consistent with blepharitis.  I have low suspicion for CVA, optic neuritis, as lack of additional symptoms. No corneal abrasions, entrapment, or dendritic staining with fluorescein study.  Presentation non-concerning for iritis, bacterial conjunctivitis, corneal abrasions, or HSV.     Care transferred to [facility], [place] who will follow up on CT scan and determine ultimate plan and disposition. If CT scan negative likely treat for blepharitis and close outpatient Opthalmology follow up.      Final Clinical Impressions(s) / ED Diagnoses   Final diagnoses:  Eyelid pain, left    ED Discharge Orders         Ordered    erythromycin ophthalmic ointment     07/23/19 1653           Chequita Mofield A, PA-C 07/23/19 1655    Bethann Berkshire, MD 07/26/19 1224

## 2019-08-15 ENCOUNTER — Other Ambulatory Visit (HOSPITAL_COMMUNITY)
Admission: RE | Admit: 2019-08-15 | Discharge: 2019-08-15 | Disposition: A | Discharge status: Home / Self Care | Payer: Medicaid Other | Source: Ambulatory Visit | Attending: Physician Assistant | Admitting: Physician Assistant

## 2019-08-15 ENCOUNTER — Other Ambulatory Visit: Payer: Self-pay

## 2019-08-15 DIAGNOSIS — E785 Hyperlipidemia, unspecified: Secondary | ICD-10-CM | POA: Insufficient documentation

## 2019-08-15 DIAGNOSIS — N189 Chronic kidney disease, unspecified: Secondary | ICD-10-CM | POA: Insufficient documentation

## 2019-08-15 LAB — COMPREHENSIVE METABOLIC PANEL
ALT: 55 U/L — ABNORMAL HIGH (ref 0–44)
AST: 32 U/L (ref 15–41)
Albumin: 4.5 g/dL (ref 3.5–5.0)
Alkaline Phosphatase: 85 U/L (ref 38–126)
Anion gap: 10 (ref 5–15)
BUN: 9 mg/dL (ref 6–20)
CO2: 26 mmol/L (ref 22–32)
Calcium: 9.2 mg/dL (ref 8.9–10.3)
Chloride: 100 mmol/L (ref 98–111)
Creatinine, Ser: 1.21 mg/dL (ref 0.61–1.24)
GFR calc Af Amer: >60 mL/min (ref >60)
GFR calc non Af Amer: >60 mL/min (ref >60)
Glucose, Bld: 98 mg/dL (ref 70–99)
Potassium: 4 mmol/L (ref 3.5–5.1)
Sodium: 136 mmol/L (ref 135–145)
Total Bilirubin: 0.6 mg/dL (ref 0.3–1.2)
Total Protein: 8.4 g/dL — ABNORMAL HIGH (ref 6.5–8.1)

## 2019-08-15 LAB — LIPID PANEL
Cholesterol: 260 mg/dL — ABNORMAL HIGH (ref 0–200)
HDL: 43 mg/dL (ref >40)
LDL Cholesterol: 182 mg/dL — ABNORMAL HIGH (ref 0–99)
Total CHOL/HDL Ratio: 6 RATIO
Triglycerides: 173 mg/dL — ABNORMAL HIGH (ref <150)
VLDL: 35 mg/dL (ref 0–40)

## 2019-08-23 ENCOUNTER — Ambulatory Visit: Payer: Medicaid Other | Admitting: Physician Assistant

## 2019-08-23 DIAGNOSIS — F17200 Nicotine dependence, unspecified, uncomplicated: Secondary | ICD-10-CM

## 2019-08-23 DIAGNOSIS — N189 Chronic kidney disease, unspecified: Secondary | ICD-10-CM

## 2019-08-23 DIAGNOSIS — E785 Hyperlipidemia, unspecified: Secondary | ICD-10-CM

## 2019-08-23 DIAGNOSIS — R7303 Prediabetes: Secondary | ICD-10-CM

## 2019-08-23 DIAGNOSIS — Z125 Encounter for screening for malignant neoplasm of prostate: Secondary | ICD-10-CM

## 2019-08-23 DIAGNOSIS — H5440 Blindness, one eye, unspecified eye: Secondary | ICD-10-CM

## 2019-08-23 NOTE — Progress Notes (Signed)
There were no vitals taken for this visit.   Subjective:    Patient ID: JSHON IBE, male    DOB: 10-06-1966, 53 y.o.   MRN: [id number]  HPI: Kevin Moreno is a 53 y.o. male presenting on 08/23/2019 for No chief complaint on file.   HPI   This is a telemedicine appointment due to coronavirus pandemic.  It is via telephone as pt does not have video enabled device  I connected with  Ned Clines on 08/23/19  by a video enabled telemedicine application and verified that I am speaking with the correct person using two identifiers.   I discussed the limitations of evaluation and management by telemedicine. The patient expressed understanding and agreed to proceed.  Pt is at home.  Provider is at office.   Pt is seeing eye doctor- in [place].  He says he has No visiion in one eye and poor vision in other eye  He is wearing a mask when he goes out.    He says he has no complaints other than his eye issues.     Relevant past medical, surgical, family and social history reviewed and updated as indicated. Interim medical history since our last visit reviewed. Allergies and medications reviewed and updated.   Current Outpatient Medications:  .  atropine 1 % ophthalmic solution, 3 (three) times daily., Disp: , Rfl:  .  hypromellose (GENTEAL) 0.3 % GEL ophthalmic ointment, Place 1 application into both eyes. Four times a day, Disp: , Rfl:  .  simvastatin (ZOCOR) 20 MG tablet, Take 1 tablet (20 mg total) by mouth at bedtime., Disp: 30 tablet, Rfl: 4 .  lisinopril (ZESTRIL) 2.5 MG tablet, Take 1 tablet (2.5 mg total) by mouth daily. (Patient not taking: Reported on 08/23/2019), Disp: 30 tablet, Rfl: 4    Review of Systems  Per HPI unless specifically indicated above     Objective:    There were no vitals taken for this visit.  Wt Readings from Last 3 Encounters:  07/23/19 160 lb (72.6 kg)  11/14/18 185 lb 8 oz (84.1 kg)  10/17/18 182 lb 8 oz (82.8 kg)     Physical Exam Pulmonary:     Effort: No respiratory distress.  Neurological:     Mental Status: He is alert and oriented to person, place, and time.  Psychiatric:        Attention and Perception: Attention normal.        Speech: Speech normal.        Behavior: Behavior is cooperative.     Results for orders placed or performed during the hospital encounter of 08/15/19  Lipid panel  Result Value Ref Range   Cholesterol 260 (H) 0 - 200 mg/dL   Triglycerides 173 (H) <150 mg/dL   HDL 43 >40 mg/dL   Total CHOL/HDL Ratio 6.0 RATIO   VLDL 35 0 - 40 mg/dL   LDL Cholesterol 182 (H) 0 - 99 mg/dL  Comprehensive metabolic panel  Result Value Ref Range   Sodium 136 135 - 145 mmol/L   Potassium 4.0 3.5 - 5.1 mmol/L   Chloride 100 98 - 111 mmol/L   CO2 26 22 - 32 mmol/L   Glucose, Bld 98 70 - 99 mg/dL   BUN 9 6 - 20 mg/dL   Creatinine, Ser 1.21 0.61 - 1.24 mg/dL   Calcium 9.2 8.9 - 10.3 mg/dL   Total Protein 8.4 (H) 6.5 - 8.1 g/dL   Albumin 4.5 3.5 - 5.0  g/dL   AST 32 15 - 41 U/L   ALT 55 (H) 0 - 44 U/L   Alkaline Phosphatase 85 38 - 126 U/L   Total Bilirubin 0.6 0.3 - 1.2 mg/dL   GFR calc non Af Amer >60 >60 mL/min   GFR calc Af Amer >60 >60 mL/min   Anion gap 10 5 - 15      Assessment & Plan:    Encounter Diagnoses  Name Primary?  . Hyperlipidemia, unspecified hyperlipidemia type Yes  . 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 Tobacco use disorder   . Chronic kidney disease, unspecified CKD stage      -Will get pt signed up for [facility]ist -Will change to atorvastatin.  Counseled lowfat diet and regular exercise. -Continue lisinopril low dose -Pt to continue [place] -pt was Scheduled for flu shot -pt to follow up 3 months.  He is to contact office sooner prn

## 2019-08-26 ENCOUNTER — Encounter: Payer: Self-pay | Admitting: Physician Assistant

## 2019-08-26 MED ORDER — LISINOPRIL 2.5 MG PO TABS: 2.5000 mg | ORAL_TABLET | Freq: Every day | ORAL | 0 refills | Status: DC

## 2019-08-26 MED ORDER — ATORVASTATIN CALCIUM 20 MG PO TABS: 20.0000 mg | ORAL_TABLET | Freq: Every day | ORAL | 1 refills | Status: DC

## 2019-10-02 ENCOUNTER — Telehealth: Payer: Self-pay

## 2019-10-02 NOTE — Telephone Encounter (Signed)
Pt. eligibility for [facility] is 10/02/2019 till 10/01/2020. I faxed over pt. Med[facility]ist Application and Other important docs.  Drema Halon

## 2019-11-23 ENCOUNTER — Other Ambulatory Visit: Payer: Self-pay

## 2019-11-23 ENCOUNTER — Other Ambulatory Visit (HOSPITAL_COMMUNITY)
Admission: RE | Admit: 2019-11-23 | Discharge: 2019-11-23 | Disposition: A | Discharge status: Home / Self Care | Payer: Medicaid Other | Source: Ambulatory Visit | Attending: Physician Assistant | Admitting: Physician Assistant

## 2019-11-23 DIAGNOSIS — E785 Hyperlipidemia, unspecified: Secondary | ICD-10-CM | POA: Insufficient documentation

## 2019-11-23 DIAGNOSIS — N189 Chronic kidney disease, unspecified: Secondary | ICD-10-CM | POA: Insufficient documentation

## 2019-11-23 DIAGNOSIS — Z125 Encounter for screening for malignant neoplasm of prostate: Secondary | ICD-10-CM | POA: Insufficient documentation

## 2019-11-23 DIAGNOSIS — R7303 Prediabetes: Secondary | ICD-10-CM

## 2019-11-23 LAB — LIPID PANEL
Cholesterol: 145 mg/dL (ref 0–200)
HDL: 43 mg/dL (ref >40)
LDL Cholesterol: 87 mg/dL (ref 0–99)
Total CHOL/HDL Ratio: 3.4 RATIO
Triglycerides: 76 mg/dL (ref <150)
VLDL: 15 mg/dL (ref 0–40)

## 2019-11-23 LAB — COMPREHENSIVE METABOLIC PANEL
ALT: 60 U/L — ABNORMAL HIGH (ref 0–44)
AST: 31 U/L (ref 15–41)
Albumin: 4.2 g/dL (ref 3.5–5.0)
Alkaline Phosphatase: 93 U/L (ref 38–126)
Anion gap: 10 (ref 5–15)
BUN: 15 mg/dL (ref 6–20)
CO2: 25 mmol/L (ref 22–32)
Calcium: 9.4 mg/dL (ref 8.9–10.3)
Chloride: 104 mmol/L (ref 98–111)
Creatinine, Ser: 1.05 mg/dL (ref 0.61–1.24)
GFR calc Af Amer: >60 mL/min (ref >60)
GFR calc non Af Amer: >60 mL/min (ref >60)
Glucose, Bld: 82 mg/dL (ref 70–99)
Potassium: 4.2 mmol/L (ref 3.5–5.1)
Sodium: 139 mmol/L (ref 135–145)
Total Bilirubin: 0.9 mg/dL (ref 0.3–1.2)
Total Protein: 7.8 g/dL (ref 6.5–8.1)

## 2019-11-23 LAB — HEMOGLOBIN A1C
Hgb A1c MFr Bld: 6.2 % — ABNORMAL HIGH (ref 4.8–5.6)
Mean Plasma Glucose: 131.24 mg/dL

## 2019-11-23 LAB — PSA: Prostatic Specific Antigen: 0.6 ng/mL (ref 0.00–4.00)

## 2019-11-26 ENCOUNTER — Ambulatory Visit: Payer: Medicaid Other | Admitting: Physician Assistant

## 2019-11-27 ENCOUNTER — Other Ambulatory Visit: Payer: Self-pay | Admitting: Physician Assistant

## 2019-11-27 ENCOUNTER — Other Ambulatory Visit: Payer: Self-pay

## 2019-11-27 ENCOUNTER — Encounter: Payer: Self-pay | Admitting: Physician Assistant

## 2019-11-27 ENCOUNTER — Ambulatory Visit: Payer: Medicaid Other | Admitting: Physician Assistant

## 2019-11-27 VITALS — BP 110/70 | HR 69 | Temp 97.3°F | Wt 182.7 lb

## 2019-11-27 DIAGNOSIS — Z1211 Encounter for screening for malignant neoplasm of colon: Secondary | ICD-10-CM

## 2019-11-27 DIAGNOSIS — E785 Hyperlipidemia, unspecified: Secondary | ICD-10-CM

## 2019-11-27 DIAGNOSIS — F17200 Nicotine dependence, unspecified, uncomplicated: Secondary | ICD-10-CM

## 2019-11-27 DIAGNOSIS — H5440 Blindness, one eye, unspecified eye: Secondary | ICD-10-CM

## 2019-11-27 MED ORDER — LISINOPRIL 2.5 MG PO TABS: 2.5000 mg | ORAL_TABLET | Freq: Every day | ORAL | 0 refills | Status: DC

## 2019-11-27 MED ORDER — ATORVASTATIN CALCIUM 20 MG PO TABS: 20.0000 mg | ORAL_TABLET | Freq: Every day | ORAL | 1 refills | Status: DC

## 2019-11-27 NOTE — Progress Notes (Signed)
BP 110/70   Pulse 69   Temp (!) 97.3 F (36.3 C)   Wt 182 lb 11.2 oz (82.9 kg)   SpO2 99%   BMI 29.49 kg/m    Subjective:    Patient ID: Kevin Moreno, male    DOB: 03-21-1966, 54 y.o.   MRN: [id number]  HPI: Kevin Moreno is a 54 y.o. male presenting on 11/27/2019 for Hyperlipidemia   HPI   Pt had negative covid 19 screening questionnaire   Pt is 54yo M who presents today for follow up of dyslipidemia.   Pt has been to eye doctor but  Not lately.   (at [facility])  He Doesn't work  He says he is doing well and has no new problems today.     Relevant past medical, surgical, family and social history reviewed and updated as indicated. Interim medical history since our last visit reviewed. Allergies and medications reviewed and updated.   Current Outpatient Medications:  .  atorvastatin (LIPITOR) 20 MG tablet, Take 1 tablet (20 mg total) by mouth daily., Disp: 90 tablet, Rfl: 1 .  lisinopril (ZESTRIL) 2.5 MG tablet, Take 1 tablet (2.5 mg total) by mouth daily., Disp: 90 tablet, Rfl: 0 .  atropine 1 % ophthalmic solution, 3 (three) times daily., Disp: , Rfl:  .  hypromellose (GENTEAL) 0.3 % GEL ophthalmic ointment, Place 1 application into both eyes. Four times a day, Disp: , Rfl:       Review of Systems  Per HPI unless specifically indicated above     Objective:    BP 110/70   Pulse 69   Temp (!) 97.3 F (36.3 C)   Wt 182 lb 11.2 oz (82.9 kg)   SpO2 99%   BMI 29.49 kg/m   Wt Readings from Last 3 Encounters:  11/27/19 182 lb 11.2 oz (82.9 kg)  07/23/19 160 lb (72.6 kg)  11/14/18 185 lb 8 oz (84.1 kg)    Physical Exam Vitals reviewed.  Constitutional:      General: He is not in acute distress.    Appearance: Normal appearance. He is well-developed. He is not ill-appearing.  HENT:     Head: Normocephalic and atraumatic.  Cardiovascular:     Rate and Rhythm: Normal rate and regular rhythm.  Pulmonary:     Effort: Pulmonary effort is  normal.     Breath sounds: Normal breath sounds. No wheezing.  Abdominal:     General: Bowel sounds are normal.     Palpations: Abdomen is soft.     Tenderness: There is no abdominal tenderness.  Musculoskeletal:     Cervical back: Neck supple.     Right lower leg: No edema.     Left lower leg: No edema.  Lymphadenopathy:     Cervical: No cervical adenopathy.  Skin:    General: Skin is warm and dry.  Neurological:     Mental Status: He is alert and oriented to person, place, and time.  Psychiatric:        Attention and Perception: Attention normal.        Speech: Speech normal.        Behavior: Behavior normal. Behavior is cooperative.     Results for orders placed or performed during the hospital encounter of 11/23/19  Hemoglobin A1c  Result Value Ref Range   Hgb A1c MFr Bld 6.2 (H) 4.8 - 5.6 %   Mean Plasma Glucose 131.24 mg/dL  PSA  Result Value Ref Range   Prostatic  Specific Antigen 0.60 0.00 - 4.00 ng/mL  Lipid panel  Result Value Ref Range   Cholesterol 145 0 - 200 mg/dL   Triglycerides 76 <150 mg/dL   HDL 43 >40 mg/dL   Total CHOL/HDL Ratio 3.4 RATIO   VLDL 15 0 - 40 mg/dL   LDL Cholesterol 87 0 - 99 mg/dL  Comprehensive metabolic panel  Result Value Ref Range   Sodium 139 135 - 145 mmol/L   Potassium 4.2 3.5 - 5.1 mmol/L   Chloride 104 98 - 111 mmol/L   CO2 25 22 - 32 mmol/L   Glucose, Bld 82 70 - 99 mg/dL   BUN 15 6 - 20 mg/dL   Creatinine, Ser 1.05 0.61 - 1.24 mg/dL   Calcium 9.4 8.9 - 10.3 mg/dL   Total Protein 7.8 6.5 - 8.1 g/dL   Albumin 4.2 3.5 - 5.0 g/dL   AST 31 15 - 41 U/L   ALT 60 (H) 0 - 44 U/L   Alkaline Phosphatase 93 38 - 126 U/L   Total Bilirubin 0.9 0.3 - 1.2 mg/dL   GFR calc non Af Amer >60 >60 mL/min   GFR calc Af Amer >60 >60 mL/min   Anion gap 10 5 - 15      Assessment & Plan:    Encounter Diagnoses  Name Primary?  . Hyperlipidemia, unspecified hyperlipidemia type Yes  . Tobacco use disorder   . Blindness of left eye with  low vision in contralateral eye   . Screening for colon cancer       -reviewed labs with pt  -pt to continue current atorvastatin for lipids -pt is counseled on diet and exercise for prediabetes -pt is given ifobt for colon cancer screening -pt encouraged toTalk with financial office at [facility] so he can return to the eye specialist.  He is given the contact information -pt to follow up 3 months with virtual appointment.  He is to contact office sooner prn

## 2019-11-27 NOTE — Patient Instructions (Addendum)
[facility] financial counseling [phone]   ----------------------------------------------------------------------- Prediabetes Eating Plan Prediabetes is a condition that causes blood sugar (glucose) levels to be higher than normal. This increases the risk for developing diabetes. In order to prevent diabetes from developing, your health care provider may recommend a diet and other lifestyle changes to help you:  Control your blood glucose levels.  Improve your cholesterol levels.  Manage your blood pressure. Your health care provider may recommend working with a diet and nutrition specialist (dietitian) to make a meal plan that is best for you. What are tips for following this plan? Lifestyle  Set weight loss goals with the help of your health care team. It is recommended that most people with prediabetes lose 7% of their current body weight.  Exercise for at least 30 minutes at least 5 days a week.  Attend a support group or seek ongoing support from a mental health counselor.  Take over-the-counter and prescription medicines only as told by your health care provider. Reading food labels  Read food labels to check the amount of fat, salt (sodium), and sugar in prepackaged foods. Avoid foods that have: ? Saturated fats. ? Trans fats. ? Added sugars.  Avoid foods that have more than 300 milligrams (mg) of sodium per serving. Limit your daily sodium intake to less than 2,300 mg each day. Shopping  Avoid buying pre-made and processed foods. Cooking  Cook with olive oil. Do not use butter, lard, or ghee.  Bake, broil, grill, or boil foods. Avoid frying. Meal planning   Work with your dietitian to develop an eating plan that is right for you. This may include: ? Tracking how many calories you take in. Use a food diary, notebook, or mobile application to track what you eat at each meal. ? Using the glycemic index (GI) to plan your meals. The index tells you how  quickly a food will raise your blood glucose. Choose low-GI foods. These foods take a longer time to raise blood glucose.  Consider following a Mediterranean diet. This diet includes: ? Several servings each day of fresh fruits and vegetables. ? Eating fish at least twice a week. ? Several servings each day of whole grains, beans, nuts, and seeds. ? Using olive oil instead of other fats. ? Moderate alcohol consumption. ? Eating small amounts of red meat and whole-fat dairy.  If you have high blood pressure, you may need to limit your sodium intake or follow a diet such as the DASH eating plan. DASH is an eating plan that aims to lower high blood pressure. What foods are recommended? The items listed below may not be a complete list. Talk with your dietitian about what dietary choices are best for you. Grains Whole grains, such as whole-wheat or whole-grain breads, crackers, cereals, and pasta. Unsweetened oatmeal. Bulgur. Barley. Quinoa. Brown rice. Corn or whole-wheat flour tortillas or taco shells. Vegetables Lettuce. Spinach. Peas. Beets. Cauliflower. Cabbage. Broccoli. Carrots. Tomatoes. Squash. Eggplant. Herbs. Peppers. Onions. Cucumbers. Brussels sprouts. Fruits Berries. Bananas. Apples. Oranges. Grapes. Papaya. Mango. Pomegranate. Kiwi. Grapefruit. Cherries. Meats and other protein foods Seafood. Poultry without skin. Lean cuts of pork and beef. Tofu. Eggs. Nuts. Beans. Dairy Low-fat or fat-free dairy products, such as yogurt, cottage cheese, and cheese. Beverages Water. Tea. Coffee. Sugar-free or diet soda. Seltzer water. Lowfat or no-fat milk. Milk alternatives, such as soy or almond milk. Fats and oils Olive oil. Canola oil. Sunflower oil. Grapeseed oil. Avocado. Walnuts. Sweets and desserts Sugar-free or low-fat pudding. Sugar-free  or low-fat ice cream and other frozen treats. Seasoning and other foods Herbs. Sodium-free spices. Mustard. Relish. Low-fat, low-sugar ketchup.  Low-fat, low-sugar barbecue sauce. Low-fat or fat-free mayonnaise. What foods are not recommended? The items listed below may not be a complete list. Talk with your dietitian about what dietary choices are best for you. Grains Refined white flour and flour products, such as bread, pasta, snack foods, and cereals. Vegetables Canned vegetables. Frozen vegetables with butter or cream sauce. Fruits Fruits canned with syrup. Meats and other protein foods Fatty cuts of meat. Poultry with skin. Breaded or fried meat. Processed meats. Dairy Full-fat yogurt, cheese, or milk. Beverages Sweetened drinks, such as sweet iced tea and soda. Fats and oils Butter. Lard. Ghee. Sweets and desserts Baked goods, such as cake, cupcakes, pastries, cookies, and cheesecake. Seasoning and other foods Spice mixes with added salt. Ketchup. Barbecue sauce. Mayonnaise. Summary  To prevent diabetes from developing, you may need to make diet and other lifestyle changes to help control blood sugar, improve cholesterol levels, and manage your blood pressure.  Set weight loss goals with the help of your health care team. It is recommended that most people with prediabetes lose 7 percent of their current body weight.  Consider following a Mediterranean diet that includes plenty of fresh fruits and vegetables, whole grains, beans, nuts, seeds, fish, lean meat, low-fat dairy, and healthy oils. This information is not intended to replace advice given to you by your health care provider. Make sure you discuss any questions you have with your health care provider. Document Revised: 02/16/2019 Document Reviewed: 12/29/2016 Elsevier Patient Education  2020 [employer].

## 2019-12-03 LAB — IFOBT (OCCULT BLOOD): IFOBT: NEGATIVE

## 2020-02-25 ENCOUNTER — Other Ambulatory Visit: Payer: Self-pay | Admitting: Physician Assistant

## 2020-02-26 ENCOUNTER — Ambulatory Visit: Payer: Medicaid Other | Admitting: Physician Assistant

## 2020-02-27 ENCOUNTER — Other Ambulatory Visit (HOSPITAL_COMMUNITY)
Admission: RE | Admit: 2020-02-27 | Discharge: 2020-02-27 | Disposition: A | Discharge status: Home / Self Care | Payer: Medicaid Other | Source: Ambulatory Visit | Attending: Physician Assistant | Admitting: Physician Assistant

## 2020-02-27 DIAGNOSIS — E785 Hyperlipidemia, unspecified: Secondary | ICD-10-CM

## 2020-02-27 LAB — COMPREHENSIVE METABOLIC PANEL
ALT: 60 U/L — ABNORMAL HIGH (ref 0–44)
AST: 31 U/L (ref 15–41)
Albumin: 4.4 g/dL (ref 3.5–5.0)
Alkaline Phosphatase: 93 U/L (ref 38–126)
Anion gap: 7 (ref 5–15)
BUN: 10 mg/dL (ref 6–20)
CO2: 28 mmol/L (ref 22–32)
Calcium: 9.2 mg/dL (ref 8.9–10.3)
Chloride: 104 mmol/L (ref 98–111)
Creatinine, Ser: 1.21 mg/dL (ref 0.61–1.24)
GFR calc Af Amer: >60 mL/min (ref >60)
GFR calc non Af Amer: >60 mL/min (ref >60)
Glucose, Bld: 89 mg/dL (ref 70–99)
Potassium: 4 mmol/L (ref 3.5–5.1)
Sodium: 139 mmol/L (ref 135–145)
Total Bilirubin: 0.8 mg/dL (ref 0.3–1.2)
Total Protein: 7.8 g/dL (ref 6.5–8.1)

## 2020-02-27 LAB — LIPID PANEL
Cholesterol: 130 mg/dL (ref 0–200)
HDL: 38 mg/dL — ABNORMAL LOW (ref >40)
LDL Cholesterol: 75 mg/dL (ref 0–99)
Total CHOL/HDL Ratio: 3.4 RATIO
Triglycerides: 84 mg/dL (ref <150)
VLDL: 17 mg/dL (ref 0–40)

## 2020-02-28 ENCOUNTER — Ambulatory Visit: Payer: Medicaid Other | Admitting: Physician Assistant

## 2020-02-28 ENCOUNTER — Encounter: Payer: Self-pay | Admitting: Physician Assistant

## 2020-02-28 DIAGNOSIS — E785 Hyperlipidemia, unspecified: Secondary | ICD-10-CM

## 2020-02-28 DIAGNOSIS — F17200 Nicotine dependence, unspecified, uncomplicated: Secondary | ICD-10-CM

## 2020-02-28 MED ORDER — ATORVASTATIN CALCIUM 20 MG PO TABS: 20.0000 mg | ORAL_TABLET | Freq: Every day | ORAL | 1 refills | Status: AC

## 2020-02-28 NOTE — Progress Notes (Signed)
There were no vitals taken for this visit.   Subjective:    Patient ID: Kevin Moreno, male    DOB: 07-23-1966, 54 y.o.   MRN: [id number]  HPI: [name] is a 54 y.o. male presenting on 02/28/2020 for No chief complaint on file.   HPI  This is a telemedicine appointment due to coronavirus pandemic.  It is via Telephone as pt does not have a video enabled device.   I connected with  Kevin Moreno on 02/28/20 by a video enabled telemedicine application and verified that I am speaking with the correct person using two identifiers.   I discussed the limitations of evaluation and management by telemedicine. The patient expressed understanding and agreed to proceed.    Pt is 53yoM with dyslipidemia.  He has no complaints today.     Relevant past medical, surgical, family and social history reviewed and updated as indicated. Interim medical history since our last visit reviewed. Allergies and medications reviewed and updated.   Current Outpatient Medications:  .  atorvastatin (LIPITOR) 20 MG tablet, Take 1 tablet (20 mg total) by mouth daily., Disp: 90 tablet, Rfl: 1 .  atropine 1 % ophthalmic solution, 3 (three) times daily., Disp: , Rfl:  .  hypromellose (GENTEAL) 0.3 % GEL ophthalmic ointment, Place 1 application into both eyes. Four times a day, Disp: , Rfl:  .  lisinopril (ZESTRIL) 2.5 MG tablet, Take 1 tablet (2.5 mg total) by mouth daily., Disp: 90 tablet, Rfl: 0   Review of Systems  Per HPI unless specifically indicated above     Objective:    There were no vitals taken for this visit.  Wt Readings from Last 3 Encounters:  11/27/19 182 lb 11.2 oz (82.9 kg)  07/23/19 160 lb (72.6 kg)  11/14/18 185 lb 8 oz (84.1 kg)    Physical Exam Pulmonary:     Effort: No respiratory distress.  Neurological:     Mental Status: He is alert and oriented to person, place, and time.  Psychiatric:        Attention and Perception: Attention normal.        Speech:  Speech normal.        Behavior: Behavior is cooperative.     Results for orders placed or performed during the hospital encounter of 02/27/20  Lipid panel  Result Value Ref Range   Cholesterol 130 0 - 200 mg/dL   Triglycerides 84 <382 mg/dL   HDL 38 (L) >50 mg/dL   Total CHOL/HDL Ratio 3.4 RATIO   VLDL 17 0 - 40 mg/dL   LDL Cholesterol 75 0 - 99 mg/dL  Comprehensive metabolic panel  Result Value Ref Range   Sodium 139 135 - 145 mmol/L   Potassium 4.0 3.5 - 5.1 mmol/L   Chloride 104 98 - 111 mmol/L   CO2 28 22 - 32 mmol/L   Glucose, Bld 89 70 - 99 mg/dL   BUN 10 6 - 20 mg/dL   Creatinine, Ser 5.39 0.61 - 1.24 mg/dL   Calcium 9.2 8.9 - 76.7 mg/dL   Total Protein 7.8 6.5 - 8.1 g/dL   Albumin 4.4 3.5 - 5.0 g/dL   AST 31 15 - 41 U/L   ALT 60 (H) 0 - 44 U/L   Alkaline Phosphatase 93 38 - 126 U/L   Total Bilirubin 0.8 0.3 - 1.2 mg/dL   GFR calc non Af Amer >60 >60 mL/min   GFR calc Af Amer >60 >60 mL/min  Anion gap 7 5 - 15      Assessment & Plan:    Encounter Diagnoses  Name Primary?  . Hyperlipidemia, unspecified hyperlipidemia type Yes  . Tobacco use disorder      -reviewed labs with pt -pt to continue current medications -pt to follow up 3 months.  He is to contact office sooner prn

## 2020-06-02 ENCOUNTER — Ambulatory Visit: Payer: Medicaid Other | Admitting: Physician Assistant
